# Patient Record
Sex: Male | Born: 1955
Health system: Southern US, Community
[De-identification: ages and names within clinical notes are randomized; demographics above are authoritative.]

## PROBLEM LIST (undated history)

## (undated) DIAGNOSIS — I10 Essential (primary) hypertension: Secondary | ICD-10-CM

## (undated) DIAGNOSIS — I4891 Unspecified atrial fibrillation: Secondary | ICD-10-CM

## (undated) DIAGNOSIS — E785 Hyperlipidemia, unspecified: Secondary | ICD-10-CM

## (undated) HISTORY — DX: Unspecified atrial fibrillation: I48.91

## (undated) HISTORY — DX: Hyperlipidemia, unspecified: E78.5

## (undated) HISTORY — PX: VITRECTOMY: SHX106

## (undated) HISTORY — PX: KNEE SURGERY: SHX244

## (undated) HISTORY — DX: Essential (primary) hypertension: I10

---

## 2008-03-27 ENCOUNTER — Ambulatory Visit (HOSPITAL_COMMUNITY): Admission: RE | Admit: 2008-03-27 | Discharge: 2008-03-28 | Payer: Self-pay | Admitting: Ophthalmology

## 2008-05-08 ENCOUNTER — Ambulatory Visit (HOSPITAL_COMMUNITY): Admission: RE | Admit: 2008-05-08 | Discharge: 2008-05-09 | Payer: Self-pay | Admitting: Ophthalmology

## 2009-11-15 IMAGING — CR DG CHEST 2V
2 series · 2 of 2 positions shown · non-contrast
Comparison: None

CLINICAL DATA: Retinal detachment lefteye. Hypertension.

CHEST - 2 VIEW

[view not recorded (1 of 2)]
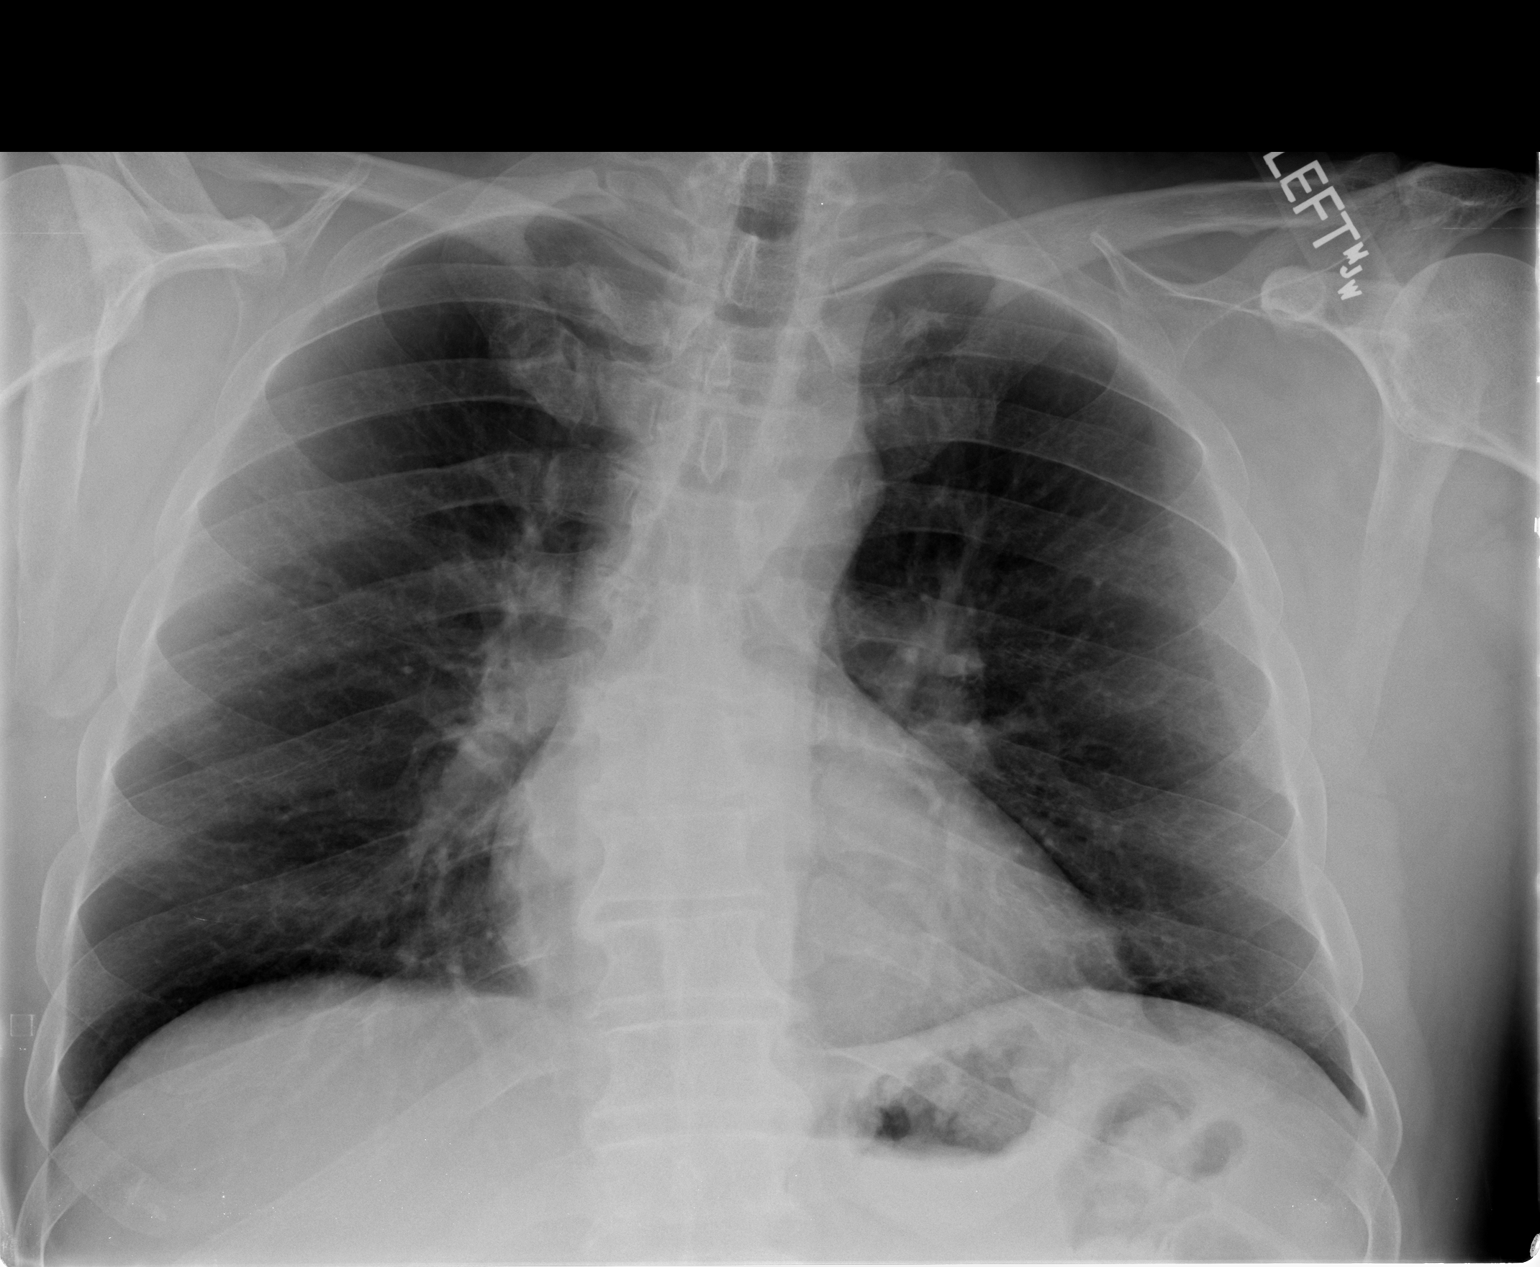

[view not recorded (2 of 2)]
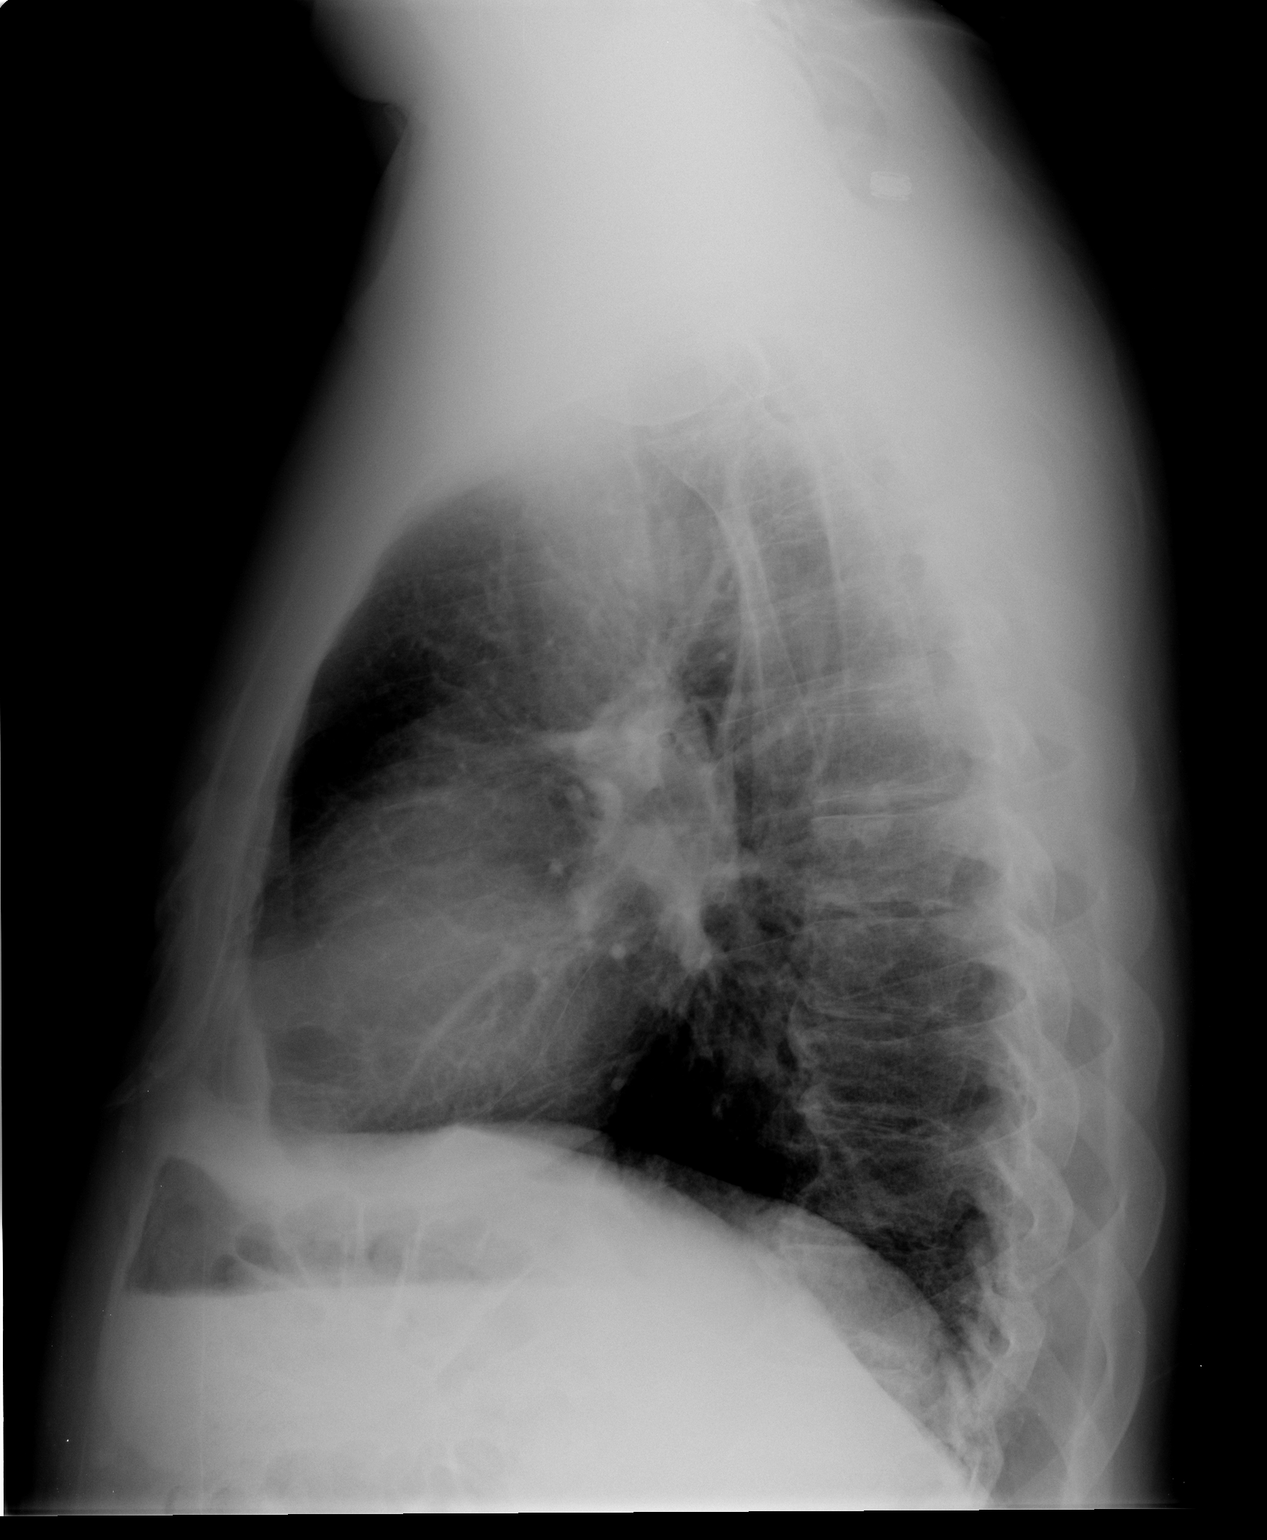

[2 of 2 positions shown; findings below may reference images not displayed]

FINDINGS: The heart size and mediastinal contours are within normal
limits.  Both lungs are clear.  The visualized skeletal structures
are unremarkable.
IMPRESSION: No active cardiopulmonary disease.

## 2010-06-03 ENCOUNTER — Ambulatory Visit (HOSPITAL_COMMUNITY)
Admission: RE | Admit: 2010-06-03 | Discharge: 2010-06-04 | Disposition: A | Discharge status: Home / Self Care | Payer: BC Managed Care – PPO | Source: Ambulatory Visit | Attending: Cardiology | Admitting: Cardiology

## 2010-06-03 ENCOUNTER — Encounter: Payer: Self-pay | Admitting: Cardiology

## 2010-06-03 ENCOUNTER — Ambulatory Visit (HOSPITAL_COMMUNITY): Payer: BC Managed Care – PPO

## 2010-06-03 DIAGNOSIS — E785 Hyperlipidemia, unspecified: Secondary | ICD-10-CM | POA: Insufficient documentation

## 2010-06-03 DIAGNOSIS — I4891 Unspecified atrial fibrillation: Secondary | ICD-10-CM | POA: Insufficient documentation

## 2010-06-03 DIAGNOSIS — H33009 Unspecified retinal detachment with retinal break, unspecified eye: Secondary | ICD-10-CM | POA: Insufficient documentation

## 2010-06-03 DIAGNOSIS — I519 Heart disease, unspecified: Secondary | ICD-10-CM | POA: Insufficient documentation

## 2010-06-03 DIAGNOSIS — Y838 Other surgical procedures as the cause of abnormal reaction of the patient, or of later complication, without mention of misadventure at the time of the procedure: Secondary | ICD-10-CM | POA: Insufficient documentation

## 2010-06-03 DIAGNOSIS — Y9210 Unspecified residential institution as the place of occurrence of the external cause: Secondary | ICD-10-CM | POA: Insufficient documentation

## 2010-06-03 DIAGNOSIS — I10 Essential (primary) hypertension: Secondary | ICD-10-CM | POA: Insufficient documentation

## 2010-06-03 DIAGNOSIS — M109 Gout, unspecified: Secondary | ICD-10-CM | POA: Insufficient documentation

## 2010-06-03 LAB — CBC
Platelets: 251 10*3/uL (ref 150–400)
RBC: 4.96 MIL/uL (ref 4.22–5.81)
RDW: 12.3 % (ref 11.5–15.5)
WBC: 12 10*3/uL — ABNORMAL HIGH (ref 4.0–10.5)

## 2010-06-03 LAB — BASIC METABOLIC PANEL
Chloride: 105 mEq/L (ref 96–112)
GFR calc non Af Amer: >60 mL/min (ref >60)
Potassium: 5 mEq/L (ref 3.5–5.1)
Sodium: 142 mEq/L (ref 135–145)

## 2010-06-03 LAB — SURGICAL PCR SCREEN
MRSA, PCR: NEGATIVE
Staphylococcus aureus: POSITIVE — AB

## 2010-06-04 ENCOUNTER — Encounter: Payer: Self-pay | Admitting: Internal Medicine

## 2010-06-04 ENCOUNTER — Encounter: Payer: Self-pay | Admitting: Cardiology

## 2010-06-04 DIAGNOSIS — I4891 Unspecified atrial fibrillation: Secondary | ICD-10-CM

## 2010-06-04 LAB — CARDIAC PANEL(CRET KIN+CKTOT+MB+TROPI)
Relative Index: INVALID (ref 0.0–2.5)
Relative Index: INVALID (ref 0.0–2.5)
Troponin I: 0.04 ng/mL (ref 0.00–0.06)

## 2010-06-04 LAB — BASIC METABOLIC PANEL
Calcium: 9.5 mg/dL (ref 8.4–10.5)
GFR calc Af Amer: >60 mL/min (ref >60)
GFR calc non Af Amer: 56 mL/min — ABNORMAL LOW (ref >60)
Glucose, Bld: 215 mg/dL — ABNORMAL HIGH (ref 70–99)
Sodium: 140 mEq/L (ref 135–145)

## 2010-06-04 LAB — MRSA PCR SCREENING: MRSA by PCR: NEGATIVE

## 2010-06-10 NOTE — Consult Note (Signed)
  NAME:  Nicholas Oneill, VIRGIN NO.:  [id number]  MEDICAL RECORD NO.:  [id number]           PATIENT TYPE:  I  LOCATION:  2924                         FACILITY:  [facility]  PHYSICIAN:  Armanda Magic, M.D.     DATE OF BIRTH:  Jun 19, 1955  DATE OF CONSULTATION:  06/04/2010 DATE OF DISCHARGE:                                CONSULTATION   REFERRING PHYSICIAN:  John D. Ashley Royalty, MD  CHIEF COMPLAINT:  Rapid atrial fibrillation, postoperative.  HISTORY OF PRESENT ILLNESS:  This is a 55 year old male with no prior cardiac history.  He underwent vitrectomy for right retinal detachment tonight, but postop felt atrial fibrillation with right ventricular response.  He was transferred to the CCU and placed on IC Cardizem drip, and subsequently converted to sinus rhythm.  He states that has had palpitations off and on in the past, but has never been evaluated.  PAST MEDICAL HISTORY:  Dyslipidemia, hypertension, and gout.  ALLERGIES:  None.MEDICATIONS:  Benicar, colchicine, Zocor.  PAST SURGICAL HISTORY:  Status post left eye vitrectomy and most recently right eye vitrectomy tonight, and also knee surgery as a child.  FAMILY HISTORY:  His mother died of unknown cause.  His father died of MI.  He has one brother with a heart problem.  SOCIAL HISTORY:  He is married, with 2 children.  He denies any tobacco use, occasional drug, alcohol, but rarely.  REVIEW OF SYSTEMS:  Otherwise stated in the HPI is negative.  PHYSICAL EXAMINATION:  VITAL SIGNS:  Blood pressure 140/79, heart rate 105. GENERAL:  He is well-developed, well-nourished white male in no acute distress. HEENT:  Benign. NECK:  Supple without lymphadenopathy.  Carotid upstrokes +2 bilaterally.  No bruits. LUNGS:  Clear to auscultation throughout. HEART:  Regular rate and rhythm.  No murmurs, rubs, or gallops.  Normal S1 and S2. ABDOMEN:  Soft, nontender, and nondistended with active bowel sounds. No  hepatosplenomegaly. EXTREMITIES:  No cyanosis, erythema, or edema.  EKG #1 preop shows sinus rhythm.  EKG #2, postop shows atrial fibrillation with rapid ventricular response, currently in sinus rhythm.  LABORATORY DATA:  Sodium 140, potassium 4.3, chloride 106, bicarb 20, BUN 16, creatinine 1.33.  White cell count 12, hemoglobin 16.5, hematocrit 47.5, and platelet count 251.  ASSESSMENT: 1. Rapid atrial fibrillation, postop after eye surgery now in sinus     rhythm with IV Cardizem drip. 2. Retinal detachment, status post right vitrectomy. 3. Dyslipidemia. 4. Hypertension. 5. Gout.  PLAN:  Continue IV Cardizem drip overnight.  We will check 2-D echocardiogram in the morning.  We will cycle cardiac enzymes.     Armanda Magic, M.D.     TT/MEDQ  D:  06/04/2010  T:  06/04/2010  Job:  [id number]  cc:   Bevelyn Buckles. Bensimhon, MD  Electronically Signed by Armanda Magic M.D. on 06/10/2010 12:08:07 PM

## 2010-06-11 NOTE — Discharge Summary (Signed)
  NAME:  Nicholas Oneill, Nicholas Oneill NO.:  [id number]  MEDICAL RECORD NO.:  [id number]           PATIENT TYPE:  I  LOCATION:  2924                         FACILITY:  [facility]  PHYSICIAN:  Cassell Clement, M.D. DATE OF BIRTH:  01/19/1956  DATE OF ADMISSION:  06/03/2010 DATE OF DISCHARGE:  06/04/2010                              DISCHARGE SUMMARY   PRIMARY CARDIOLOGIST:  Learta Codding, MD, [facility].  DISCHARGE DIAGNOSIS:  Atrial fibrillation with rapid ventricular response.  SECONDARY DIAGNOSES: 1. Hypertension. 2. Hyperlipidemia. 3. Gout. 4. Right retinal detachment status post scleral buckle with right eye     and retinal photocoagulation by Dr. Alan Mulder.  ALLERGIES:  No known drug allergies.  PROCEDURES:  2-D echocardiogram, February 2012, showing an EF of 60% to 65% with normal wall motion.  Mild LVH.  No regional wall motion abnormalities.  HISTORY OF PRESENT ILLNESS:  A 55 year old male with the above problem list.  The patient has a history of right retinal detachment and presented to [facility] for right eye surgery which was uneventful. Unfortunately, postoperatively the patient developed tachypalpitations, subsequently found to have AFib with RVR.  Cardiology was called for evaluation and admission.  HOSPITAL COURSE:  The patient was placed on IV diltiazem with initial rate improvement and subsequent conversion to sinus rhythm.  Cardiac enzymes have remained negative.  A 2-D echocardiogram this morning shows normal LV function.  The patient has a CHADS2 score of 1 with a prior history of hypertension, and at this time, he is not on Coumadin or aspirin secondary to recent eye surgery.  We do recommend initiating aspirin in the long-term once this is okay with Ophthalmology.  The patient's diltiazem has been converted to oral diltiazem and he will be discharged home today in good condition.  DISCHARGE LABS:  Hemoglobin 16.5, hematocrit 47.5, WBC 12.0,  platelets 251.  Sodium 140, potassium 4.3, chloride 106, CO2 of 23, BUN 16, creatinine 1.33, glucose 215, calcium 9.5, CK 62, MB 1.8, troponin I is 0.03.  DISPOSITION:  The patient will be discharged home today in good condition.  FOLLOWUP PLANS AND APPOINTMENTS:  We have arranged for followup with Dr. Andee Lineman in our [place] office on June 17, 2010 at 3.30 p.m.  The patient will follow up with Dr. Ashley Royalty next week.  DISCHARGE MEDICATIONS: 1. Bacitracin/polymyxin B ophthalmic ointment, right eye, 3 times a     day. 2. Diltiazem CD 180 mg daily. 3. Gatifloxacin 0.5% 1 drop q.i.d. 4. Prednisolone ophthalmic 1% 1 drop q.i.d. 5. Cherry concentrate over-the-counter 1 tablet daily. 6. Colchicine 0.6 mg daily. 7. Toprol-XL 100 mg daily. 8. Multivitamin 1 tablet daily. 9. Simvastatin 20 mg every evening.  OUTSTANDING LAB STUDIES:  None.  DURATION OF DISCHARGE/ENCOUNTER:  45 minutes including physician time.     Nicolasa Ducking, ANP   ______________________________ Cassell Clement, M.D.    CB/MEDQ  D:  06/04/2010  T:  06/05/2010  Job:  [id number]  cc:   Beulah Gandy. Ashley Royalty, M.D.  Electronically Signed by Nicolasa Ducking ANP on 06/09/2010 12:06:18 PM Electronically Signed by Cassell Clement M.D. on 06/11/2010 01:03:59 PM

## 2010-06-17 ENCOUNTER — Encounter (INDEPENDENT_AMBULATORY_CARE_PROVIDER_SITE_OTHER): Payer: BC Managed Care – PPO | Admitting: Cardiology

## 2010-06-17 ENCOUNTER — Encounter: Payer: Self-pay | Admitting: Cardiology

## 2010-06-17 ENCOUNTER — Encounter: Payer: BC Managed Care – PPO | Admitting: Cardiology

## 2010-06-17 DIAGNOSIS — M109 Gout, unspecified: Secondary | ICD-10-CM | POA: Insufficient documentation

## 2010-06-17 DIAGNOSIS — I4891 Unspecified atrial fibrillation: Secondary | ICD-10-CM | POA: Insufficient documentation

## 2010-06-17 DIAGNOSIS — I10 Essential (primary) hypertension: Secondary | ICD-10-CM | POA: Insufficient documentation

## 2010-06-17 DIAGNOSIS — E785 Hyperlipidemia, unspecified: Secondary | ICD-10-CM | POA: Insufficient documentation

## 2010-06-18 NOTE — Consult Note (Signed)
Summary: [facility] Consultation Report  [facility] Consultation Report   Imported By: Earl Many 06/10/2010 17:36:16  _____________________________________________________________________  External Attachment:    Type:   Image     Comment:   External Document

## 2010-06-30 NOTE — Op Note (Signed)
  NAME:  Nicholas Oneill, Nicholas Oneill NO.:  [id number]  MEDICAL RECORD NO.:  [id number]           PATIENT TYPE:  I  LOCATION:  2924                         FACILITY:  [facility]  PHYSICIAN:  Beulah Gandy. Ashley Royalty, M.D. DATE OF BIRTH:  08-08-1955  DATE OF PROCEDURE:  06/03/2010 DATE OF DISCHARGE:                              OPERATIVE REPORT   ADMISSION DIAGNOSIS:  Rhegmatogenous retinal detachment in the right eye.  PROCEDURE:  Scleral buckle, right eye and retinal photocoagulation, right eye.  SURGEON:  Beulah Gandy. Ashley Royalty, MD  ASSISTANT:  Rosalie Doctor, MA  ANESTHESIA:  General.  DETAILS:  After usual prep and drape, 360-degree limbal peritomy, isolation of 4 rectus muscles on 2-0 silk, localization of detachment from 3 o'clock to 9 o'clock.  Scleral dissection from 1 o'clock to 11 o'clock to admit a #279 intrascleral implant.  Diathermy placed in the bed.  The 279 implant placed against the globe with a 240 band and a 270 sleeve at 1 o'clock.  Perforation site chosen at 6 o'clock.  At the posterior aspect of the bed, a moderate amount of clear yellow subretinal fluid came forth in a controlled manner.  The fluid was xanthochromic.  The scleral flaps were closed with 4-0 Mersilene suture. Paracentesis x2 obtained a pressure of 10 with a Barraquer tonometer. The scleral flaps were closed, sutures knotted, and the free ends removed.  The buckle was trimmed.  The band was trimmed.  Indirect ophthalmoscopy revealed the retina to be lying nicely in place on the scleral buckle.  The indirect ophthalmoscope laser was moved into place, 481 burns were placed in the retinal periphery and the power was between 300 and 400 milliwatts, 1000 microns each, and 0.1 seconds each.  The conjunctiva was reposited with 7-0 chromic suture.  Polymyxin and gentamicin were irrigated into Tenon space.  Atropine solution was applied.  Marcaine was injected around the globe for postop pain. Closing pressure  was 10 with a Barraquer tonometer.  Decadron 10 mg was injected into the lower subconjunctival space.  Polysporin ophthalmic ointment, patch, and shield were placed.  The patient was awakened and taken to recovery in a satisfactory condition.     Beulah Gandy. Ashley Royalty, M.D.     JDM/MEDQ  D:  06/03/2010  T:  06/04/2010  Job:  [id number]  Electronically Signed by Alan Mulder M.D. on 06/30/2010 06:45:02 AM

## 2010-07-08 NOTE — Assessment & Plan Note (Signed)
Summary: eph d/c [facility] for A Fib per chris B. Vs   Visit Type:  Follow-up Primary Provider:  Selinda Flavin   History of Present Illness: The patient had a recent right retinal detachment and presented to [facility] for right eye surgery which was complicated by postoperative atrial fibrillation.  Cardiology is called for an evaluation and management.  The patient was placed on IV diltiazem and converted back to normal sinus rhythm.  An echocardiogram was performed which showed normal LV function.  No aspirin or Coumadin was started given his recent retinal detachment.CHADS2 score =1.  The patient also has a history of hypertension, hyperlipidemia and gout. The patient has had no recurrent problems with palpitations.  He has been started on p.o. Cardizem.  He has done well with this in addition to his metoprolol.   Preventive Screening-Counseling & Management  Alcohol-Tobacco     Smoking Status: never  Current Medications (verified): 1)  Prednisolone Acetate 1 % Susp (Prednisolone Acetate) .Marland Kitchen.. 1 Drop Od Four Times A Day 2)  Zymaxid 0.5 % Soln (Gatifloxacin) .Marland Kitchen.. 1 Drop Od Four Times A Day 3)  Polysporin 500-10000 Unit/gm Oint (Bacitracin-Polymyxin B) .... Apply Od Three Times A Day 4)  Cardizem Cd 180 Mg Xr24h-Cap (Diltiazem Hcl Coated Beads) .... Take 1 Tablet By Mouth Once A Day 5)  Multivitamins  Tabs (Multiple Vitamin) .... Take 1 Tablet By Mouth Once A Day 6)  Colcrys 0.6 Mg Tabs (Colchicine) .... Take 1 Tablet By Mouth Once A Day 7)  Metoprolol Succinate 100 Mg Xr24h-Tab (Metoprolol Succinate) .... Take 1 Tablet By Mouth Once A Day 8)  Simvastatin 20 Mg Tabs (Simvastatin) .... Take 1 Tablet By Mouth Once A Day 9)  Tart Cherry Advanced  Caps (Misc Natural Products) .... Take 1 Tablet By Mouth Once A Day  Allergies (verified): No Known Drug Allergies  Comments:  Nurse/Medical Assistant: The patient's medications and allergies were reviewed with the patient and were  updated in the Medication and Allergy Lists. Tammi Romine CMA (07-08-10 1:44 PM)  Past History:  Past Medical History: Last updated: 2010-07-08   Dyslipidemia   Hypertension   Gout Atrial Fibrillation  Family History: Last updated: 08-Jul-2010  His mother died of unknown cause.     He has one brother with a heart problem.   His father died of MI  Social History: Last updated: 07-08-2010 Married  Alcohol Use - yes- on occasion  occasional drug use per note of [facility] Feb 2012  Risk Factors: Smoking Status: never (07-08-2010)  Social History: Smoking Status:  never  Review of Systems  The patient denies fatigue, malaise, fever, weight gain/loss, vision loss, decreased hearing, hoarseness, chest pain, palpitations, shortness of breath, prolonged cough, wheezing, sleep apnea, coughing up blood, abdominal pain, blood in stool, nausea, vomiting, diarrhea, heartburn, incontinence, blood in urine, muscle weakness, joint pain, leg swelling, rash, skin lesions, headache, fainting, dizziness, depression, anxiety, enlarged lymph nodes, easy bruising or bleeding, and environmental allergies.    Vital Signs:  Patient profile:   55 year old male Height:      72 inches Weight:      229 pounds BMI:     31.17 O2 Sat:      97 % on Room air Pulse rate:   74 / minute BP sitting:   158 / 88  (left arm) Cuff size:   large  Vitals Entered By: Fuller Plan CMA (08-Jul-2010 1:39 PM)  O2 Flow:  Room  air  Physical Exam  Additional Exam:  General: Well-developed, well-nourished in no distress head: Normocephalic and atraumatic eyes PERRLA/EOMI intact, conjunctiva and lids normal nose: No deformity or lesions mouth normal dentition, normal posterior pharynx neck: Supple, no JVD.  No masses, thyromegaly or abnormal cervical nodes lungs: Normal breath sounds bilaterally without wheezing.  Normal percussion heart: regular rate and rhythm with normal S1 and S2, no S3 or S4.  PMI is  normal.  No pathological murmurs abdomen: Normal bowel sounds, abdomen is soft and nontender without masses, organomegaly or hernias noted.  No hepatosplenomegaly musculoskeletal: Back normal, normal gait muscle strength and tone normal pulsus: Pulse is normal in all 4 extremities Extremities: No peripheral pitting edema neurologic: Alert and oriented x 3 skin: Intact without lesions or rashes cervical nodes: No significant adenopathy psychologic: Normal affect    EKG  Procedure date:  06/17/2010  Findings:      normal sinus rhythm  otherwise normal EKG.  Heart rate 67 beats/min  Impression & Recommendations:  Problem # 1:  ATRIAL FIBRILLATION (ICD-427.31) postoperative atrial fibrillation no indication for anticoagulation aspirin can be started when safe from a perspective of his retinal detachment. His updated medication list for this problem includes:    Metoprolol Succinate 100 Mg Xr24h-tab (Metoprolol succinate) .Marland Kitchen... Take 1 tablet by mouth once a day  Orders: EKG w/ Interpretation (93000)  Problem # 2:  HYPERTENSION, BENIGN (ICD-401.1) normal LV function White coat hypertension as the patient to follow-up on his blood pressure.  His updated medication list for this problem includes:    Cardizem Cd 180 Mg Xr24h-cap (Diltiazem hcl coated beads) .Marland Kitchen... Take 1 tablet by mouth once a day    Metoprolol Succinate 100 Mg Xr24h-tab (Metoprolol succinate) .Marland Kitchen... Take 1 tablet by mouth once a day  Patient Instructions: 1)  Your physician wants you to follow-up in: 6 months. You will receive a reminder letter in the mail one-two months in advance. If you don't receive a letter, please call our office to schedule the follow-up appointment. 2)  Your physician recommends that you continue on your current medications as directed. Please refer to the Current Medication list given to you today.

## 2010-08-11 LAB — BASIC METABOLIC PANEL
CO2: 22 mEq/L (ref 19–32)
Calcium: 9.3 mg/dL (ref 8.4–10.5)
GFR calc Af Amer: >60 mL/min (ref >60)
Potassium: 4.6 mEq/L (ref 3.5–5.1)
Sodium: 136 mEq/L (ref 135–145)

## 2010-08-11 LAB — CBC
Hemoglobin: 16.2 g/dL (ref 13.0–17.0)
MCHC: 33.8 g/dL (ref 30.0–36.0)
RBC: 4.94 MIL/uL (ref 4.22–5.81)

## 2010-09-09 NOTE — Op Note (Signed)
NAME:  Nicholas Oneill, HARMES NO.:  [id number]   MEDICAL RECORD NO.:  [id number]          PATIENT TYPE:  OIB   LOCATION:  5118                         FACILITY:  [facility]   PHYSICIAN:  Beulah Gandy. Ashley Royalty, M.D. DATE OF BIRTH:  10/08/1955   DATE OF PROCEDURE:  05/08/2008  DATE OF DISCHARGE:                               OPERATIVE REPORT   ADMISSION DIAGNOSIS:  Proliferative vitreoretinopathy with vitreous  traction.   PROCEDURES:  Pars plana vitrectomy with 25-gauge system, retinal  photocoagulation, membrane peel, and gas-fluid exchange, left eye.   SURGEON:  Beulah Gandy. Ashley Royalty, MD   ASSISTANT:  Rosalie Doctor, MA   ANESTHESIA:  General.   DETAILS:  Usual prep and drape, 25-gauge trocars at 10, 2, and 4  o'clock, infusion at 4 o'clock.  The lighted pick and the cutter were  placed at 10 and 2 o'clock respectively.  Provisc placed on the corneal  surface.  The BIOM viewing system was moved into place.  Pars plana  vitrectomy was begun just behind the crystalline lens.  A thick fibrous  ring was seen centrally, which extended with thick fibrous sheaths to  the edges of the buckle.  This ring was carefully removed under low  suction and rapid cutting after it was incised and many meridians that  was incised and removed down to the buckle surface and the retina  surface.  The membrane was peeled along the retinal surface on the  buckle with the lighted pick.  Additional vitrectomy was carried  posteriorly down to the macular surface.  Some surface wrinkling was  seen temporally with the retina thrown into flat folds.  The area was  bright.  Additional vitrectomy was carried out down to the vitreous base  for 360 degrees.  The endolaser was positioned in the eye.  1130 burns  placed around the retinal periphery, the power was 2300 milliwatts, 1000  microns each, and 0.1 seconds each.  A 30% gas-fluid exchange was  performed.  The instruments were removed from the eye, and the  trocars  were removed.  The wounds were tested and found to be tight.  Polymyxin  and gentamicin were irrigated into Tenon space.  Decadron 10 mg was  injected into the lower subconjunctival space.  Marcaine was injected  around the globe for postop pain.  Polysporin ophthalmic ointment, a  patch and shield were placed.  The patient was awakened, taken to  recovery in satisfactory condition.  Closing pressure 10 with a  Barraquer tonometer.   COMPLICATIONS:  None.   DURATION:  30 minutes.      Beulah Gandy. Ashley Royalty, M.D.  Electronically Signed     JDM/MEDQ  D:  05/08/2008  T:  05/09/2008  Job:  [id number]

## 2010-09-09 NOTE — Op Note (Signed)
NAME:  Nicholas Oneill, Nicholas Oneill NO.:  [id number]   MEDICAL RECORD NO.:  [id number]          PATIENT TYPE:  OIB   LOCATION:  5151                         FACILITY:  [facility]   PHYSICIAN:  Beulah Gandy. Ashley Royalty, M.D. DATE OF BIRTH:  1955/07/14   DATE OF PROCEDURE:  03/27/2008  DATE OF DISCHARGE:                               OPERATIVE REPORT   ADMISSION DIAGNOSIS:  Rhegmatogenous retinal detachment, left eye.   PROCEDURE:  1. Scleral buckle, left eye.  2. Retinal photocoagulation, left eye.  3. Gas injection, left eye.   SURGEON:  Beulah Gandy. Ashley Royalty, MD   ASSISTANT:  Rosalie Doctor, MA   ANESTHESIA:  General.   DETAILS:  Usual prep and drape.  A 360-degree limbal peritomy.  Isolation of 4 rectus muscles on 2-0 silk.  Scleral dissection per 360  degrees to admit #279 intrascleral implant.  Diathermy placed in the  bed.  Radial 508G segment was fashioned to fit beneath the breaks at 9  o'clock.  A 279 implant was placed around the globe with a joint at 3  o'clock, 240 band placed around the globe with a 270 sleeve at 10  o'clock.  Perforation site chosen at 8 o'clock in the posterior aspect  of the bed.  A large amount of clear colorless subretinal fluid came  forth.  Perfluoropropane 35% 2 mL was injected into the vitreous cavity  to reinflate the globe.  The buckle elements were placed, and the  scleral flaps were closed.  Indirect ophthalmoscopy showed the retina to  be lying nicely and was placed on the scleral buckle with the breaks  stretched across the radial element at 9 o'clock.  The indirect  ophthalmoscope laser was moved into place, 1013 burns were placed around  the retinal breaks and the periphery.  The power was 1200 milliwatts,  1000 microns each 0.1 seconds each.  The buckle was adjusted and  trimmed.  The band was adjusted and trimmed.  The sutures were knotted,  and the free ends removed.  The conjunctiva was reposited with 7-0  chromic suture.  Polymyxin and  gentamicin were irrigated at Tenon space.  Atropine solution was applied.  Decadron 10 mg was injected into the  lower subconjunctival space.  Marcaine was injected around the globe for  postop pain.  Closing pressure was 10 with a Barraquer tonometer.  Polysporin ophthalmic ointment, a patch, and shield were placed.  The  patient was awakened and taken to the recovery in satisfactory  condition.   COMPLICATIONS:  None.   DURATION:  2 hours.     Beulah Gandy. Ashley Royalty, M.D.  Electronically Signed    JDM/MEDQ  D:  03/27/2008  T:  03/28/2008  Job:  [id number]

## 2010-11-20 ENCOUNTER — Encounter (INDEPENDENT_AMBULATORY_CARE_PROVIDER_SITE_OTHER): Payer: BC Managed Care – PPO | Admitting: Ophthalmology

## 2010-11-20 DIAGNOSIS — H35379 Puckering of macula, unspecified eye: Secondary | ICD-10-CM

## 2010-11-20 DIAGNOSIS — H43819 Vitreous degeneration, unspecified eye: Secondary | ICD-10-CM

## 2010-11-20 DIAGNOSIS — H26499 Other secondary cataract, unspecified eye: Secondary | ICD-10-CM

## 2010-11-20 DIAGNOSIS — H33009 Unspecified retinal detachment with retinal break, unspecified eye: Secondary | ICD-10-CM

## 2010-12-26 ENCOUNTER — Other Ambulatory Visit: Payer: Self-pay | Admitting: Cardiovascular Disease

## 2011-01-20 ENCOUNTER — Encounter: Payer: Self-pay | Admitting: Cardiology

## 2011-01-22 ENCOUNTER — Encounter: Payer: Self-pay | Admitting: Cardiology

## 2011-01-22 ENCOUNTER — Ambulatory Visit: Payer: BC Managed Care – PPO | Admitting: Cardiology

## 2011-01-23 ENCOUNTER — Encounter (INDEPENDENT_AMBULATORY_CARE_PROVIDER_SITE_OTHER): Payer: BC Managed Care – PPO | Admitting: Ophthalmology

## 2011-01-28 ENCOUNTER — Encounter (INDEPENDENT_AMBULATORY_CARE_PROVIDER_SITE_OTHER): Payer: BC Managed Care – PPO | Admitting: Ophthalmology

## 2011-01-28 DIAGNOSIS — H26499 Other secondary cataract, unspecified eye: Secondary | ICD-10-CM

## 2011-01-28 DIAGNOSIS — H33009 Unspecified retinal detachment with retinal break, unspecified eye: Secondary | ICD-10-CM

## 2011-01-28 DIAGNOSIS — H35379 Puckering of macula, unspecified eye: Secondary | ICD-10-CM

## 2011-01-28 DIAGNOSIS — H43819 Vitreous degeneration, unspecified eye: Secondary | ICD-10-CM

## 2011-01-30 LAB — COMPREHENSIVE METABOLIC PANEL
CO2: 26 mEq/L (ref 19–32)
Calcium: 9.3 mg/dL (ref 8.4–10.5)
Creatinine, Ser: 0.87 mg/dL (ref 0.4–1.5)
GFR calc Af Amer: >60 mL/min (ref >60)
GFR calc non Af Amer: >60 mL/min (ref >60)
Glucose, Bld: 88 mg/dL (ref 70–99)

## 2011-01-30 LAB — CBC
Hemoglobin: 15.2 g/dL (ref 13.0–17.0)
MCHC: 33.7 g/dL (ref 30.0–36.0)
MCV: 97.2 fL (ref 78.0–100.0)
RBC: 4.64 MIL/uL (ref 4.22–5.81)

## 2011-02-02 ENCOUNTER — Encounter: Payer: Self-pay | Admitting: Cardiology

## 2011-02-02 ENCOUNTER — Ambulatory Visit (INDEPENDENT_AMBULATORY_CARE_PROVIDER_SITE_OTHER): Payer: BC Managed Care – PPO | Admitting: Cardiology

## 2011-02-02 VITALS — BP 159/93 | HR 65 | Resp 18 | Ht 72.0 in | Wt 234.0 lb

## 2011-02-02 DIAGNOSIS — I10 Essential (primary) hypertension: Secondary | ICD-10-CM

## 2011-02-02 DIAGNOSIS — I4891 Unspecified atrial fibrillation: Secondary | ICD-10-CM

## 2011-02-02 MED ORDER — DILTIAZEM HCL ER COATED BEADS 180 MG PO CP24: 180.0000 mg | ORAL_CAPSULE | Freq: Every day | ORAL | Status: DC

## 2011-02-02 NOTE — Assessment & Plan Note (Signed)
Whitecoat hypertension. Otherwise stable. No change in medical regimen for now

## 2011-02-02 NOTE — Patient Instructions (Signed)
Continue all current medications. Your physician wants you to follow up in:  1 year.  You will receive a reminder letter in the mail one-two months in advance.  If you don't receive a letter, please call our office to schedule the follow up appointment   

## 2011-02-02 NOTE — Progress Notes (Signed)
History of present illness:  The patient is a 55 year old male with a history of postoperative atrial fibrillation after eye surgery. The patient is currently not on Coumadin and remains in normal sinus rhythm. He has had no recurrent palpitations. He is both on a beta blocker and calcium channel blocker. He denies any chest pain shortness of breath orthopnea PND.   Allergies, family history and social history: As documented in chart and reviewed  Medications: Documented and reviewed in chart.  Past medical history: Reviewed and see problem list below   Review of systems:   Physical examination : No nausea or vomiting. No fever or chills no melena hematochezia. No dysuria or frequency Vital signs documented below General: Well-nourished white male in no apparent distress HEENT: Normal carotids stroke no carotid bruits, no thyromegaly nonnodular thyroid. EOMI PERRLA Lungs: Clear breath sounds bilaterally. No wheezing Heart: Regular rate and rhythm with normal S1-S2 no murmur rubs or gallops Abdomen: Soft nontender no rebound or guarding Extremity exam: No cyanosis clubbing or edema Neurologic: Alert and oriented grossly nonfocal Psychiatric: Normal affect. Vascular exam: Normal pulses.

## 2011-02-02 NOTE — Assessment & Plan Note (Signed)
History of paroxysmal atrial fibrillation. Patient remains in normal sinus rhythm. No indication for anticoagulation

## 2011-02-03 ENCOUNTER — Other Ambulatory Visit: Payer: Self-pay | Admitting: *Deleted

## 2011-02-03 MED ORDER — DILTIAZEM HCL ER COATED BEADS 180 MG PO CP24: 180.0000 mg | ORAL_CAPSULE | Freq: Every day | ORAL | Status: DC

## 2011-02-11 ENCOUNTER — Encounter (INDEPENDENT_AMBULATORY_CARE_PROVIDER_SITE_OTHER): Payer: BC Managed Care – PPO | Admitting: Ophthalmology

## 2011-02-11 DIAGNOSIS — H2700 Aphakia, unspecified eye: Secondary | ICD-10-CM

## 2011-08-12 ENCOUNTER — Ambulatory Visit (INDEPENDENT_AMBULATORY_CARE_PROVIDER_SITE_OTHER): Payer: BC Managed Care – PPO | Admitting: Ophthalmology

## 2011-08-12 DIAGNOSIS — H33009 Unspecified retinal detachment with retinal break, unspecified eye: Secondary | ICD-10-CM

## 2011-08-12 DIAGNOSIS — H43819 Vitreous degeneration, unspecified eye: Secondary | ICD-10-CM

## 2011-08-12 DIAGNOSIS — H35379 Puckering of macula, unspecified eye: Secondary | ICD-10-CM

## 2011-11-10 ENCOUNTER — Encounter (INDEPENDENT_AMBULATORY_CARE_PROVIDER_SITE_OTHER): Payer: BC Managed Care – PPO | Admitting: Ophthalmology

## 2011-11-10 DIAGNOSIS — H33009 Unspecified retinal detachment with retinal break, unspecified eye: Secondary | ICD-10-CM

## 2011-11-10 DIAGNOSIS — H35379 Puckering of macula, unspecified eye: Secondary | ICD-10-CM

## 2011-11-10 DIAGNOSIS — H4010X0 Unspecified open-angle glaucoma, stage unspecified: Secondary | ICD-10-CM

## 2011-11-10 DIAGNOSIS — H2700 Aphakia, unspecified eye: Secondary | ICD-10-CM

## 2012-05-10 ENCOUNTER — Ambulatory Visit (INDEPENDENT_AMBULATORY_CARE_PROVIDER_SITE_OTHER): Payer: BC Managed Care – PPO | Admitting: Ophthalmology

## 2012-05-25 ENCOUNTER — Ambulatory Visit (INDEPENDENT_AMBULATORY_CARE_PROVIDER_SITE_OTHER): Payer: BC Managed Care – PPO | Admitting: Ophthalmology

## 2012-05-25 DIAGNOSIS — H43819 Vitreous degeneration, unspecified eye: Secondary | ICD-10-CM

## 2012-05-25 DIAGNOSIS — H35379 Puckering of macula, unspecified eye: Secondary | ICD-10-CM

## 2012-05-25 DIAGNOSIS — H35359 Cystoid macular degeneration, unspecified eye: Secondary | ICD-10-CM

## 2012-05-25 DIAGNOSIS — H33009 Unspecified retinal detachment with retinal break, unspecified eye: Secondary | ICD-10-CM

## 2013-02-22 ENCOUNTER — Ambulatory Visit (INDEPENDENT_AMBULATORY_CARE_PROVIDER_SITE_OTHER): Payer: BC Managed Care – PPO | Admitting: Ophthalmology

## 2013-02-22 DIAGNOSIS — H43819 Vitreous degeneration, unspecified eye: Secondary | ICD-10-CM

## 2013-02-22 DIAGNOSIS — H33009 Unspecified retinal detachment with retinal break, unspecified eye: Secondary | ICD-10-CM

## 2013-02-22 DIAGNOSIS — H35379 Puckering of macula, unspecified eye: Secondary | ICD-10-CM

## 2013-12-13 ENCOUNTER — Ambulatory Visit (INDEPENDENT_AMBULATORY_CARE_PROVIDER_SITE_OTHER): Payer: BC Managed Care – PPO | Admitting: Ophthalmology

## 2013-12-20 ENCOUNTER — Ambulatory Visit (INDEPENDENT_AMBULATORY_CARE_PROVIDER_SITE_OTHER): Payer: BC Managed Care – PPO | Admitting: Ophthalmology

## 2013-12-20 DIAGNOSIS — H43819 Vitreous degeneration, unspecified eye: Secondary | ICD-10-CM

## 2013-12-20 DIAGNOSIS — H35379 Puckering of macula, unspecified eye: Secondary | ICD-10-CM

## 2013-12-20 DIAGNOSIS — I10 Essential (primary) hypertension: Secondary | ICD-10-CM

## 2013-12-20 DIAGNOSIS — H2700 Aphakia, unspecified eye: Secondary | ICD-10-CM

## 2013-12-20 DIAGNOSIS — H35039 Hypertensive retinopathy, unspecified eye: Secondary | ICD-10-CM

## 2014-09-26 ENCOUNTER — Ambulatory Visit (INDEPENDENT_AMBULATORY_CARE_PROVIDER_SITE_OTHER): Payer: BC Managed Care – PPO | Admitting: Ophthalmology

## 2014-10-24 ENCOUNTER — Ambulatory Visit (INDEPENDENT_AMBULATORY_CARE_PROVIDER_SITE_OTHER): Payer: Self-pay | Admitting: Ophthalmology

## 2014-10-31 ENCOUNTER — Ambulatory Visit (INDEPENDENT_AMBULATORY_CARE_PROVIDER_SITE_OTHER): Payer: BLUE CROSS/BLUE SHIELD | Admitting: Ophthalmology

## 2014-10-31 DIAGNOSIS — H35372 Puckering of macula, left eye: Secondary | ICD-10-CM

## 2014-10-31 DIAGNOSIS — H35033 Hypertensive retinopathy, bilateral: Secondary | ICD-10-CM | POA: Diagnosis not present

## 2014-10-31 DIAGNOSIS — I10 Essential (primary) hypertension: Secondary | ICD-10-CM | POA: Diagnosis not present

## 2014-10-31 DIAGNOSIS — H3531 Nonexudative age-related macular degeneration: Secondary | ICD-10-CM

## 2014-10-31 DIAGNOSIS — H43813 Vitreous degeneration, bilateral: Secondary | ICD-10-CM | POA: Diagnosis not present

## 2014-10-31 DIAGNOSIS — H338 Other retinal detachments: Secondary | ICD-10-CM | POA: Diagnosis not present

## 2015-08-01 ENCOUNTER — Ambulatory Visit (INDEPENDENT_AMBULATORY_CARE_PROVIDER_SITE_OTHER): Payer: BLUE CROSS/BLUE SHIELD | Admitting: Ophthalmology

## 2015-08-22 ENCOUNTER — Ambulatory Visit (INDEPENDENT_AMBULATORY_CARE_PROVIDER_SITE_OTHER): Payer: BLUE CROSS/BLUE SHIELD | Admitting: Ophthalmology

## 2015-08-22 DIAGNOSIS — I10 Essential (primary) hypertension: Secondary | ICD-10-CM | POA: Diagnosis not present

## 2015-08-22 DIAGNOSIS — H35033 Hypertensive retinopathy, bilateral: Secondary | ICD-10-CM

## 2015-08-22 DIAGNOSIS — H338 Other retinal detachments: Secondary | ICD-10-CM | POA: Diagnosis not present

## 2015-08-22 DIAGNOSIS — H35372 Puckering of macula, left eye: Secondary | ICD-10-CM

## 2015-08-22 DIAGNOSIS — H353131 Nonexudative age-related macular degeneration, bilateral, early dry stage: Secondary | ICD-10-CM

## 2015-09-06 DIAGNOSIS — H35372 Puckering of macula, left eye: Secondary | ICD-10-CM | POA: Diagnosis not present

## 2015-09-06 DIAGNOSIS — H43811 Vitreous degeneration, right eye: Secondary | ICD-10-CM | POA: Diagnosis not present

## 2015-09-06 DIAGNOSIS — H43391 Other vitreous opacities, right eye: Secondary | ICD-10-CM | POA: Diagnosis not present

## 2015-10-11 DIAGNOSIS — E782 Mixed hyperlipidemia: Secondary | ICD-10-CM | POA: Diagnosis not present

## 2015-10-11 DIAGNOSIS — I10 Essential (primary) hypertension: Secondary | ICD-10-CM | POA: Diagnosis not present

## 2015-10-11 DIAGNOSIS — R739 Hyperglycemia, unspecified: Secondary | ICD-10-CM | POA: Diagnosis not present

## 2015-10-17 DIAGNOSIS — M1000 Idiopathic gout, unspecified site: Secondary | ICD-10-CM | POA: Diagnosis not present

## 2015-10-17 DIAGNOSIS — Z6831 Body mass index (BMI) 31.0-31.9, adult: Secondary | ICD-10-CM | POA: Diagnosis not present

## 2015-10-17 DIAGNOSIS — Z1389 Encounter for screening for other disorder: Secondary | ICD-10-CM | POA: Diagnosis not present

## 2015-10-17 DIAGNOSIS — I10 Essential (primary) hypertension: Secondary | ICD-10-CM | POA: Diagnosis not present

## 2015-10-17 DIAGNOSIS — E782 Mixed hyperlipidemia: Secondary | ICD-10-CM | POA: Diagnosis not present

## 2015-12-10 DIAGNOSIS — H35033 Hypertensive retinopathy, bilateral: Secondary | ICD-10-CM | POA: Diagnosis not present

## 2016-03-09 DIAGNOSIS — I10 Essential (primary) hypertension: Secondary | ICD-10-CM | POA: Diagnosis not present

## 2016-03-09 DIAGNOSIS — M1000 Idiopathic gout, unspecified site: Secondary | ICD-10-CM | POA: Diagnosis not present

## 2016-03-09 DIAGNOSIS — E782 Mixed hyperlipidemia: Secondary | ICD-10-CM | POA: Diagnosis not present

## 2016-03-09 DIAGNOSIS — R739 Hyperglycemia, unspecified: Secondary | ICD-10-CM | POA: Diagnosis not present

## 2016-03-13 DIAGNOSIS — I10 Essential (primary) hypertension: Secondary | ICD-10-CM | POA: Diagnosis not present

## 2016-03-13 DIAGNOSIS — M1000 Idiopathic gout, unspecified site: Secondary | ICD-10-CM | POA: Diagnosis not present

## 2016-03-13 DIAGNOSIS — E875 Hyperkalemia: Secondary | ICD-10-CM | POA: Diagnosis not present

## 2016-03-13 DIAGNOSIS — E782 Mixed hyperlipidemia: Secondary | ICD-10-CM | POA: Diagnosis not present

## 2016-04-04 DIAGNOSIS — Z6827 Body mass index (BMI) 27.0-27.9, adult: Secondary | ICD-10-CM | POA: Diagnosis not present

## 2016-04-04 DIAGNOSIS — J019 Acute sinusitis, unspecified: Secondary | ICD-10-CM | POA: Diagnosis not present

## 2016-05-04 DIAGNOSIS — Z6827 Body mass index (BMI) 27.0-27.9, adult: Secondary | ICD-10-CM | POA: Diagnosis not present

## 2016-05-04 DIAGNOSIS — E875 Hyperkalemia: Secondary | ICD-10-CM | POA: Diagnosis not present

## 2016-05-04 DIAGNOSIS — J019 Acute sinusitis, unspecified: Secondary | ICD-10-CM | POA: Diagnosis not present

## 2016-05-04 DIAGNOSIS — Z6828 Body mass index (BMI) 28.0-28.9, adult: Secondary | ICD-10-CM | POA: Diagnosis not present

## 2016-05-25 ENCOUNTER — Ambulatory Visit (INDEPENDENT_AMBULATORY_CARE_PROVIDER_SITE_OTHER): Payer: BLUE CROSS/BLUE SHIELD | Admitting: Ophthalmology

## 2016-05-27 ENCOUNTER — Ambulatory Visit (INDEPENDENT_AMBULATORY_CARE_PROVIDER_SITE_OTHER): Payer: BLUE CROSS/BLUE SHIELD | Admitting: Ophthalmology

## 2016-05-27 DIAGNOSIS — H43813 Vitreous degeneration, bilateral: Secondary | ICD-10-CM | POA: Diagnosis not present

## 2016-05-27 DIAGNOSIS — I10 Essential (primary) hypertension: Secondary | ICD-10-CM

## 2016-05-27 DIAGNOSIS — H35373 Puckering of macula, bilateral: Secondary | ICD-10-CM | POA: Diagnosis not present

## 2016-05-27 DIAGNOSIS — H338 Other retinal detachments: Secondary | ICD-10-CM | POA: Diagnosis not present

## 2016-05-27 DIAGNOSIS — H35033 Hypertensive retinopathy, bilateral: Secondary | ICD-10-CM | POA: Diagnosis not present

## 2016-09-08 DIAGNOSIS — E782 Mixed hyperlipidemia: Secondary | ICD-10-CM | POA: Diagnosis not present

## 2016-09-08 DIAGNOSIS — E875 Hyperkalemia: Secondary | ICD-10-CM | POA: Diagnosis not present

## 2016-09-08 DIAGNOSIS — I10 Essential (primary) hypertension: Secondary | ICD-10-CM | POA: Diagnosis not present

## 2016-09-08 DIAGNOSIS — R739 Hyperglycemia, unspecified: Secondary | ICD-10-CM | POA: Diagnosis not present

## 2016-09-11 DIAGNOSIS — E782 Mixed hyperlipidemia: Secondary | ICD-10-CM | POA: Diagnosis not present

## 2016-09-11 DIAGNOSIS — Z6829 Body mass index (BMI) 29.0-29.9, adult: Secondary | ICD-10-CM | POA: Diagnosis not present

## 2016-09-11 DIAGNOSIS — M1000 Idiopathic gout, unspecified site: Secondary | ICD-10-CM | POA: Diagnosis not present

## 2016-09-11 DIAGNOSIS — I10 Essential (primary) hypertension: Secondary | ICD-10-CM | POA: Diagnosis not present

## 2017-02-08 DIAGNOSIS — M1000 Idiopathic gout, unspecified site: Secondary | ICD-10-CM | POA: Diagnosis not present

## 2017-02-08 DIAGNOSIS — I10 Essential (primary) hypertension: Secondary | ICD-10-CM | POA: Diagnosis not present

## 2017-02-08 DIAGNOSIS — E782 Mixed hyperlipidemia: Secondary | ICD-10-CM | POA: Diagnosis not present

## 2017-02-08 DIAGNOSIS — E875 Hyperkalemia: Secondary | ICD-10-CM | POA: Diagnosis not present

## 2017-02-12 DIAGNOSIS — E875 Hyperkalemia: Secondary | ICD-10-CM | POA: Diagnosis not present

## 2017-02-12 DIAGNOSIS — I10 Essential (primary) hypertension: Secondary | ICD-10-CM | POA: Diagnosis not present

## 2017-02-12 DIAGNOSIS — E782 Mixed hyperlipidemia: Secondary | ICD-10-CM | POA: Diagnosis not present

## 2017-02-12 DIAGNOSIS — M1000 Idiopathic gout, unspecified site: Secondary | ICD-10-CM | POA: Diagnosis not present

## 2017-03-03 ENCOUNTER — Ambulatory Visit (INDEPENDENT_AMBULATORY_CARE_PROVIDER_SITE_OTHER): Payer: BLUE CROSS/BLUE SHIELD | Admitting: Ophthalmology

## 2017-03-03 DIAGNOSIS — H338 Other retinal detachments: Secondary | ICD-10-CM

## 2017-03-03 DIAGNOSIS — H35372 Puckering of macula, left eye: Secondary | ICD-10-CM

## 2017-03-03 DIAGNOSIS — I10 Essential (primary) hypertension: Secondary | ICD-10-CM

## 2017-03-03 DIAGNOSIS — H35033 Hypertensive retinopathy, bilateral: Secondary | ICD-10-CM | POA: Diagnosis not present

## 2017-03-03 DIAGNOSIS — H43813 Vitreous degeneration, bilateral: Secondary | ICD-10-CM

## 2017-04-15 DIAGNOSIS — Z6832 Body mass index (BMI) 32.0-32.9, adult: Secondary | ICD-10-CM | POA: Diagnosis not present

## 2017-04-15 DIAGNOSIS — L309 Dermatitis, unspecified: Secondary | ICD-10-CM | POA: Diagnosis not present

## 2017-05-07 DIAGNOSIS — Z6833 Body mass index (BMI) 33.0-33.9, adult: Secondary | ICD-10-CM | POA: Diagnosis not present

## 2017-05-07 DIAGNOSIS — R55 Syncope and collapse: Secondary | ICD-10-CM | POA: Diagnosis not present

## 2017-05-07 DIAGNOSIS — I4891 Unspecified atrial fibrillation: Secondary | ICD-10-CM | POA: Diagnosis not present

## 2017-05-13 DIAGNOSIS — I4891 Unspecified atrial fibrillation: Secondary | ICD-10-CM | POA: Diagnosis not present

## 2017-05-13 DIAGNOSIS — R55 Syncope and collapse: Secondary | ICD-10-CM | POA: Diagnosis not present

## 2017-05-13 DIAGNOSIS — I517 Cardiomegaly: Secondary | ICD-10-CM | POA: Diagnosis not present

## 2017-05-27 ENCOUNTER — Ambulatory Visit: Payer: BLUE CROSS/BLUE SHIELD | Admitting: Cardiovascular Disease

## 2017-05-27 ENCOUNTER — Encounter: Payer: Self-pay | Admitting: Cardiovascular Disease

## 2017-05-27 ENCOUNTER — Encounter: Payer: Self-pay | Admitting: *Deleted

## 2017-05-27 VITALS — BP 122/72 | HR 70 | Ht 72.0 in | Wt 245.0 lb

## 2017-05-27 DIAGNOSIS — R55 Syncope and collapse: Secondary | ICD-10-CM | POA: Diagnosis not present

## 2017-05-27 DIAGNOSIS — I480 Paroxysmal atrial fibrillation: Secondary | ICD-10-CM | POA: Diagnosis not present

## 2017-05-27 DIAGNOSIS — Z9289 Personal history of other medical treatment: Secondary | ICD-10-CM | POA: Diagnosis not present

## 2017-05-27 DIAGNOSIS — I10 Essential (primary) hypertension: Secondary | ICD-10-CM

## 2017-05-27 MED ORDER — DILTIAZEM HCL 30 MG PO TABS: 30.0000 mg | ORAL_TABLET | ORAL | 2 refills | Status: AC | PRN

## 2017-05-27 NOTE — Progress Notes (Signed)
CARDIOLOGY CONSULT NOTE  Patient ID: Nicholas Oneill MRN: [id number] DOB/AGE: 1956-03-02 62 y.o.  Admit date: (Not on file) Primary Physician: Selinda Flavin, MD Referring Physician: Roma Kayser PA-C  Reason for Consultation: Atrial fibrillation and syncope  HPI: Nicholas Oneill is a 62 y.o. male who is being seen today for the evaluation of atrial fibrillation and syncope at the request of Roma Kayser PA-C.   He has a history dating back to at least 2012 of atrial fibrillation.  I reviewed a discharge summary from February 2012 which showed that he developed postoperative rapid atrial fibrillation after right eye retinal detachment surgery.  I personally reviewed an ECG performed on 05/07/17 which demonstrated sinus rhythm with no ischemic ST segment or T wave abnormalities, nor any arrhythmias.  He was apparently evaluated in the ED in [place] at [facility] for syncope and was found to be in atrial fibrillation.  I reviewed labs performed on 05/07/17: White blood cell 7.5, hemoglobin 15.4, platelets 314, BUN 14, creatinine 1.07, sodium 145, potassium 5.1, TSH 2.  Heart rate in the ED was apparently 188 bpm.  It appears he did not actually lose consciousness but almost passed out and felt dizzy with palpitations.  There is no associated chest pain.  I reviewed all relevant documentation from the ED evaluation.  He was markedly hypertensive on 05/05/17, 191/96.  Oxygen saturations were in the high 90% range.  He was started on long-acting diltiazem.  Troponins were normal.  Chest x-ray showed no acute cardiopulmonary disease.  He tells me he was standing at work and was 2 minutes from punching out leaning against a wall when he began to experience blurry vision and palpitations.  He does not appear he actually lost consciousness.  He denies antecedent chest pain or shortness of breath.  He said he really had not experienced any symptoms like this since 2012.  He did have another  episode late one night a few days ago but the episode lasted seconds.  He said he was sitting on the toilet and his vision became cloudy.  He denies leg swelling, orthopnea, and paroxysmal nocturnal dyspnea.  He denies a history of sleep apnea.  It appears an echocardiogram was performed.  I will have to request a copy.     Allergies  Allergen Reactions  . Other Itching    YELLOW DYE     Current Outpatient Medications  Medication Sig Dispense Refill  . colchicine-probenecid 0.5-500 MG tablet Take 1 tablet by mouth 2 (two) times daily.    Marland Kitchen diltiazem (DILACOR XR) 240 MG 24 hr capsule Take 240 mg by mouth daily.    Marland Kitchen ibuprofen (ADVIL,MOTRIN) 800 MG tablet Take 800 mg by mouth every 8 (eight) hours as needed.    . Multiple Vitamin (MULTIVITAMIN) tablet Take 1 tablet by mouth daily.       No current facility-administered medications for this visit.     Past Medical History:  Diagnosis Date  . Atrial fibrillation (HCC)   . Dyslipidemia   . Gout   . Hypertension     Past Surgical History:  Procedure Laterality Date  . KNEE SURGERY     As a child  . VITRECTOMY     Left and right eye    Social History   Socioeconomic History  . Marital status: Married    Spouse name: Not on file  . Number of children: Not on file  . Years of education: Not  on file  . Highest education level: Not on file  Social Needs  . Financial resource strain: Not on file  . Food insecurity - worry: Not on file  . Food insecurity - inability: Not on file  . Transportation needs - medical: Not on file  . Transportation needs - non-medical: Not on file  Occupational History  . Not on file  Tobacco Use  . Smoking status: Never Smoker  . Smokeless tobacco: Never Used  Substance and Sexual Activity  . Alcohol use: Yes    Comment: Occasionally  . Drug use: Yes    Comment: Occasionally  . Sexual activity: Not on file  Other Topics Concern  . Not on file  Social History Narrative  . Not on file      No family history of premature CAD in 1st degree relatives.  Current Meds  Medication Sig  . colchicine-probenecid 0.5-500 MG tablet Take 1 tablet by mouth 2 (two) times daily.  Marland Kitchen. diltiazem (DILACOR XR) 240 MG 24 hr capsule Take 240 mg by mouth daily.  Marland Kitchen. ibuprofen (ADVIL,MOTRIN) 800 MG tablet Take 800 mg by mouth every 8 (eight) hours as needed.  . Multiple Vitamin (MULTIVITAMIN) tablet Take 1 tablet by mouth daily.        Review of systems complete and found to be negative unless listed above in HPI    Physical exam Blood pressure 122/72, pulse 70, height 6' (1.829 m), weight 245 lb (111.1 kg), SpO2 98 %. General: NAD Neck: No JVD, no thyromegaly or thyroid nodule.  Lungs: Clear to auscultation bilaterally with normal respiratory effort. CV: Nondisplaced PMI. Regular rate and rhythm, normal S1/S2, no S3/S4, no murmur.  No peripheral edema.  No carotid bruit.    Abdomen: Soft, nontender, no distention.  Skin: Intact without lesions or rashes.  Neurologic: Alert and oriented x 3.  Psych: Normal affect. Extremities: No clubbing or cyanosis.  HEENT: Normal.   ECG: Most recent ECG reviewed.   Labs: Lab Results  Component Value Date/Time   K 4.3 06/03/2010 11:44 PM   BUN 16 06/03/2010 11:44 PM   CREATININE 1.33 06/03/2010 11:44 PM   ALT 26 03/27/2008 12:29 PM   HGB 16.5 06/03/2010 10:19 AM     Lipids: No results found for: LDLCALC, LDLDIRECT, CHOL, TRIG, HDL      ASSESSMENT AND PLAN:  1.  Paroxysmal atrial fibrillation: Symptomatically stable on long-acting diltiazem 240 mg.  It appears he has been on this dose for quite some time preceding the ED evaluation.  CHADSVASC score is 1 based on hypertension, indicating a low thromboembolic risk.  I have asked him to start taking aspirin 81 mg daily. [facility].  I will request a copy of this report for personal review. I will prescribe short acting diltiazem 30 mg  tablets to be taken as needed for sustained tachycardia/palpitations of 5 minutes or longer.  I also spoke to him about the necessity of starting systemic anticoagulation once he turns 62 years old.  I also explained the possibility of atrial fibrillation ablation.  2. Hypertension: Blood pressure is controlled.  No changes.    Disposition: Follow up in 3 months  Signed: Prentice DockerSuresh Diannie Willner, M.D., F.A.C.C.  05/27/2017, 8:47 AM

## 2017-05-27 NOTE — Patient Instructions (Signed)
Medication Instructions:   Begin Diltiazem 30mg  (short acting) as needed palpitations.  Continue all other medications.    Labwork: none  Testing/Procedures: none  Follow-Up: 3 months   Any Other Special Instructions Will Be Listed Below (If Applicable).  If you need a refill on your cardiac medications before your next appointment, please call your pharmacy.

## 2017-06-02 DIAGNOSIS — I480 Paroxysmal atrial fibrillation: Secondary | ICD-10-CM | POA: Diagnosis not present

## 2017-06-02 DIAGNOSIS — Z6833 Body mass index (BMI) 33.0-33.9, adult: Secondary | ICD-10-CM | POA: Diagnosis not present

## 2017-06-29 DIAGNOSIS — R739 Hyperglycemia, unspecified: Secondary | ICD-10-CM | POA: Diagnosis not present

## 2017-06-29 DIAGNOSIS — E782 Mixed hyperlipidemia: Secondary | ICD-10-CM | POA: Diagnosis not present

## 2017-06-29 DIAGNOSIS — I480 Paroxysmal atrial fibrillation: Secondary | ICD-10-CM | POA: Diagnosis not present

## 2017-06-29 DIAGNOSIS — I10 Essential (primary) hypertension: Secondary | ICD-10-CM | POA: Diagnosis not present

## 2017-07-02 DIAGNOSIS — E782 Mixed hyperlipidemia: Secondary | ICD-10-CM | POA: Diagnosis not present

## 2017-07-02 DIAGNOSIS — I10 Essential (primary) hypertension: Secondary | ICD-10-CM | POA: Diagnosis not present

## 2017-07-02 DIAGNOSIS — I480 Paroxysmal atrial fibrillation: Secondary | ICD-10-CM | POA: Diagnosis not present

## 2017-07-02 DIAGNOSIS — R739 Hyperglycemia, unspecified: Secondary | ICD-10-CM | POA: Diagnosis not present

## 2017-07-12 DIAGNOSIS — J111 Influenza due to unidentified influenza virus with other respiratory manifestations: Secondary | ICD-10-CM | POA: Diagnosis not present

## 2017-07-12 DIAGNOSIS — Z6833 Body mass index (BMI) 33.0-33.9, adult: Secondary | ICD-10-CM | POA: Diagnosis not present

## 2017-09-06 ENCOUNTER — Ambulatory Visit: Payer: BLUE CROSS/BLUE SHIELD | Admitting: Cardiovascular Disease

## 2017-09-06 ENCOUNTER — Encounter: Payer: Self-pay | Admitting: Cardiovascular Disease

## 2017-09-06 VITALS — BP 158/82 | HR 76 | Ht 72.0 in | Wt 247.0 lb

## 2017-09-06 DIAGNOSIS — I480 Paroxysmal atrial fibrillation: Secondary | ICD-10-CM | POA: Diagnosis not present

## 2017-09-06 DIAGNOSIS — I10 Essential (primary) hypertension: Secondary | ICD-10-CM

## 2017-09-06 NOTE — Patient Instructions (Signed)

## 2017-09-06 NOTE — Progress Notes (Signed)
SUBJECTIVE: The patient presents for follow-up of paroxysmal atrial fibrillation.  He has been doing very well recently.  He recently retired from [employer] and his stress levels have considerably decreased.  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  He denies chest pain, palpitations, leg swelling, and shortness of breath.  His wife who is a Runner, broadcasting/film/video is also going to retire this year.  He has hobbies such as NASCAR and collects things related to this.  He tells me his blood pressure at his PCPs office was 138/76 recently.  Echocardiogram performed on 05/13/2017 at [facility] demonstrated normal left ventricular systolic function, LVEF 60 to 16%, mild LVH, and mild left atrial enlargement.    Review of Systems: As per "subjective", otherwise negative.  Allergies  Allergen Reactions  . Other Itching    YELLOW DYE     Current Outpatient Medications  Medication Sig Dispense Refill  . colchicine-probenecid 0.5-500 MG tablet Take 1 tablet by mouth 2 (two) times daily.    Marland Kitchen diltiazem (CARDIZEM) 30 MG tablet Take 1 tablet (30 mg total) by mouth as needed (palpitaitons). 30 tablet 2  . diltiazem (DILACOR XR) 240 MG 24 hr capsule Take 240 mg by mouth daily.    Marland Kitchen ibuprofen (ADVIL,MOTRIN) 800 MG tablet Take 800 mg by mouth every 8 (eight) hours as needed.    . Multiple Vitamin (MULTIVITAMIN) tablet Take 1 tablet by mouth daily.       No current facility-administered medications for this visit.     Past Medical History:  Diagnosis Date  . Atrial fibrillation (HCC)   . Dyslipidemia   . Gout   . Hypertension     Past Surgical History:  Procedure Laterality Date  . KNEE SURGERY     As a child  . VITRECTOMY     Left and right eye    Social History   Socioeconomic History  . Marital status: Married    Spouse name: Not on file  .  Number of children: Not on file  . Years of education: Not on file  . Highest education level: Not on file  Occupational History  . Not on file  Social Needs  . Financial resource strain: Not on file  . Food insecurity:    Worry: Not on file    Inability: Not on file  . Transportation needs:    Medical: Not on file    Non-medical: Not on file  Tobacco Use  . Smoking status: Never Smoker  . Smokeless tobacco: Never Used  Substance and Sexual Activity  . Alcohol use: Yes    Comment: Occasionally  . Drug use: Yes    Comment: Occasionally  . Sexual activity: Not on file  Lifestyle  . Physical activity:    Days per week: Not on file    Minutes per session: Not on file  . Stress: Not on file  Relationships  . Social connections:    Talks on phone: Not on file    Gets together: Not on file    Attends religious service: Not on file    Active member of club or organization: Not on file    Attends meetings of clubs or organizations: Not on file    Relationship status: Not on file  . Intimate partner violence:    Fear of  current or ex partner: Not on file    Emotionally abused: Not on file    Physically abused: Not on file    Forced sexual activity: Not on file  Other Topics Concern  . Not on file  Social History Narrative  . Not on file     Vitals:   09/06/17 0817  BP: (!) 158/82  Pulse: 76  SpO2: 98%  Weight: 247 lb (112 kg)  Height: 6' (1.829 m)    Wt Readings from Last 3 Encounters:  09/06/17 247 lb (112 kg)  05/27/17 245 lb (111.1 kg)  02/02/11 234 lb (106.1 kg)     PHYSICAL EXAM General: NAD HEENT: Normal. Neck: No JVD, no thyromegaly. Lungs: Clear to auscultation bilaterally with normal respiratory effort. CV: Regular rate and rhythm, normal S1/S2, no S3/S4, no murmur. No pretibial or periankle edema.     Abdomen: Soft, nontender, no distention.  Neurologic: Alert and oriented.  Psych: Normal affect. Skin: Normal. Musculoskeletal: No gross  deformities.    ECG: Most recent ECG reviewed.   Labs: Lab Results  Component Value Date/Time   K 4.3 06/03/2010 11:44 PM   BUN 16 06/03/2010 11:44 PM   CREATININE 1.33 06/03/2010 11:44 PM   ALT 26 03/27/2008 12:29 PM   HGB 16.5 06/03/2010 10:19 AM     Lipids: No results found for: LDLCALC, LDLDIRECT, CHOL, TRIG, HDL     ASSESSMENT AND PLAN:  1.  Paroxysmal atrial fibrillation: Symptomatically stable on long-acting diltiazem 240 mg. CHADSVASC score is 1 based on hypertension, indicating a low thromboembolic risk.  I previously asked him to start taking aspirin 81 mg daily. Left ventricular systolic function is normal.  The left atrium is mildly enlarged. I previously prescribed short acting diltiazem 30 mg tablets to be taken as needed for sustained tachycardia/palpitations of 5 minutes or longer.  I also previously spoke to him about the necessity of starting systemic anticoagulation once he turns 62 years old.  I also explained the possibility of atrial fibrillation ablation.  2. Hypertension: Blood pressure is elevated today but was recently normal at his PCPs office.  This will need continued monitoring.  No changes.     Disposition: Follow up 6 months   Prentice Docker, M.D., F.A.C.C.

## 2017-11-02 DIAGNOSIS — I480 Paroxysmal atrial fibrillation: Secondary | ICD-10-CM | POA: Diagnosis not present

## 2017-11-02 DIAGNOSIS — E782 Mixed hyperlipidemia: Secondary | ICD-10-CM | POA: Diagnosis not present

## 2017-11-02 DIAGNOSIS — I10 Essential (primary) hypertension: Secondary | ICD-10-CM | POA: Diagnosis not present

## 2017-11-02 DIAGNOSIS — M1000 Idiopathic gout, unspecified site: Secondary | ICD-10-CM | POA: Diagnosis not present

## 2017-11-02 DIAGNOSIS — R7302 Impaired glucose tolerance (oral): Secondary | ICD-10-CM | POA: Diagnosis not present

## 2017-11-05 DIAGNOSIS — I480 Paroxysmal atrial fibrillation: Secondary | ICD-10-CM | POA: Diagnosis not present

## 2017-11-05 DIAGNOSIS — E782 Mixed hyperlipidemia: Secondary | ICD-10-CM | POA: Diagnosis not present

## 2017-11-05 DIAGNOSIS — R739 Hyperglycemia, unspecified: Secondary | ICD-10-CM | POA: Diagnosis not present

## 2017-11-05 DIAGNOSIS — I10 Essential (primary) hypertension: Secondary | ICD-10-CM | POA: Diagnosis not present

## 2017-11-05 DIAGNOSIS — Z1331 Encounter for screening for depression: Secondary | ICD-10-CM | POA: Diagnosis not present

## 2017-11-05 DIAGNOSIS — Z1389 Encounter for screening for other disorder: Secondary | ICD-10-CM | POA: Diagnosis not present

## 2017-12-01 ENCOUNTER — Encounter (INDEPENDENT_AMBULATORY_CARE_PROVIDER_SITE_OTHER): Payer: BLUE CROSS/BLUE SHIELD | Admitting: Ophthalmology

## 2017-12-01 DIAGNOSIS — H35033 Hypertensive retinopathy, bilateral: Secondary | ICD-10-CM | POA: Diagnosis not present

## 2017-12-01 DIAGNOSIS — I10 Essential (primary) hypertension: Secondary | ICD-10-CM

## 2017-12-01 DIAGNOSIS — H338 Other retinal detachments: Secondary | ICD-10-CM | POA: Diagnosis not present

## 2017-12-01 DIAGNOSIS — H35372 Puckering of macula, left eye: Secondary | ICD-10-CM | POA: Diagnosis not present

## 2017-12-01 DIAGNOSIS — H43813 Vitreous degeneration, bilateral: Secondary | ICD-10-CM

## 2018-03-15 ENCOUNTER — Ambulatory Visit: Payer: BLUE CROSS/BLUE SHIELD | Admitting: Cardiovascular Disease

## 2018-04-04 DIAGNOSIS — M1000 Idiopathic gout, unspecified site: Secondary | ICD-10-CM | POA: Diagnosis not present

## 2018-04-04 DIAGNOSIS — I480 Paroxysmal atrial fibrillation: Secondary | ICD-10-CM | POA: Diagnosis not present

## 2018-04-04 DIAGNOSIS — I10 Essential (primary) hypertension: Secondary | ICD-10-CM | POA: Diagnosis not present

## 2018-04-04 DIAGNOSIS — E782 Mixed hyperlipidemia: Secondary | ICD-10-CM | POA: Diagnosis not present

## 2018-04-04 DIAGNOSIS — R739 Hyperglycemia, unspecified: Secondary | ICD-10-CM | POA: Diagnosis not present

## 2018-04-08 DIAGNOSIS — I480 Paroxysmal atrial fibrillation: Secondary | ICD-10-CM | POA: Diagnosis not present

## 2018-04-08 DIAGNOSIS — E782 Mixed hyperlipidemia: Secondary | ICD-10-CM | POA: Diagnosis not present

## 2018-04-08 DIAGNOSIS — R739 Hyperglycemia, unspecified: Secondary | ICD-10-CM | POA: Diagnosis not present

## 2018-04-08 DIAGNOSIS — I10 Essential (primary) hypertension: Secondary | ICD-10-CM | POA: Diagnosis not present

## 2018-06-03 ENCOUNTER — Encounter (INDEPENDENT_AMBULATORY_CARE_PROVIDER_SITE_OTHER): Payer: BLUE CROSS/BLUE SHIELD | Admitting: Ophthalmology

## 2018-06-03 DIAGNOSIS — I10 Essential (primary) hypertension: Secondary | ICD-10-CM | POA: Diagnosis not present

## 2018-06-03 DIAGNOSIS — H43813 Vitreous degeneration, bilateral: Secondary | ICD-10-CM

## 2018-06-03 DIAGNOSIS — H35033 Hypertensive retinopathy, bilateral: Secondary | ICD-10-CM | POA: Diagnosis not present

## 2018-06-03 DIAGNOSIS — H338 Other retinal detachments: Secondary | ICD-10-CM | POA: Diagnosis not present

## 2018-06-03 DIAGNOSIS — H35372 Puckering of macula, left eye: Secondary | ICD-10-CM | POA: Diagnosis not present

## 2018-06-07 ENCOUNTER — Ambulatory Visit: Payer: BLUE CROSS/BLUE SHIELD | Admitting: Cardiovascular Disease

## 2018-06-07 ENCOUNTER — Encounter: Payer: Self-pay | Admitting: Cardiovascular Disease

## 2018-06-07 ENCOUNTER — Encounter: Payer: Self-pay | Admitting: *Deleted

## 2018-06-07 VITALS — BP 152/100 | HR 79 | Ht 72.0 in | Wt 266.0 lb

## 2018-06-07 DIAGNOSIS — I10 Essential (primary) hypertension: Secondary | ICD-10-CM | POA: Diagnosis not present

## 2018-06-07 DIAGNOSIS — I480 Paroxysmal atrial fibrillation: Secondary | ICD-10-CM

## 2018-06-07 NOTE — Progress Notes (Signed)
SUBJECTIVE: The patient presents for follow-up of paroxysmal atrial fibrillation.  Echocardiogram performed on 05/13/2017 at [facility] demonstrated normal left ventricular systolic function, LVEF 60 to 67%, mild LVH, and mild left atrial enlargement.  Hemoglobin 15.4 and creatinine 1.07 on 05/07/2017.  TSH was normal.  He has had 2 episodes of palpitations since his last visit with me.  One occurred while he was walking at a flea market in [place], [place].  He felt fatigued for about 2 days.  Another episode occurred when he was eating out with his wife in [place].  He denies chest pain, leg swelling, orthopnea, and shortness of breath.  ECG performed in the office today which I ordered and personally interpreted demonstrates normal sinus rhythm with no ischemic ST segment or T-wave abnormalities, nor any arrhythmias.  He was told by his PCP that he needs to lose weight and said he has lost about 7 pounds in the past 4 weeks.  He is back to riding his bike.  He does not check his blood pressure at home.   Social history: He is retired from [employer] in 2019. He has hobbies such as NASCAR and collects things related to this.  Review of Systems: As per "subjective", otherwise negative.  Allergies  Allergen Reactions  . Other Itching    YELLOW DYE     Current Outpatient Medications  Medication Sig Dispense Refill  . colchicine-probenecid 0.5-500 MG tablet Take 1 tablet by mouth 2 (two) times daily.    Marland Kitchen diltiazem (CARDIZEM) 30 MG tablet Take 1 tablet (30 mg total) by mouth as needed (palpitaitons). 30 tablet 2  . diltiazem (DILACOR XR) 240 MG 24 hr capsule Take 240 mg by mouth daily.    Marland Kitchen ibuprofen (ADVIL,MOTRIN) 800 MG tablet Take 800 mg by mouth every 8 (eight) hours as needed.    . Multiple Vitamin (MULTIVITAMIN) tablet Take 1 tablet by mouth daily.       No current facility-administered medications for this visit.     Past Medical History:  Diagnosis Date  .  Atrial fibrillation (HCC)   . Dyslipidemia   . Gout   . Hypertension     Past Surgical History:  Procedure Laterality Date  . KNEE SURGERY     As a child  . VITRECTOMY     Left and right eye    Social History   Socioeconomic History  . Marital status: Married    Spouse name: Not on file  . Number of children: Not on file  . Years of education: Not on file  . Highest education level: Not on file  Occupational History  . Not on file  Social Needs  . Financial resource strain: Not on file  . Food insecurity:    Worry: Not on file    Inability: Not on file  . Transportation needs:    Medical: Not on file    Non-medical: Not on file  Tobacco Use  . Smoking status: Never Smoker  . Smokeless tobacco: Never Used  Substance and Sexual Activity  . Alcohol use: Yes    Comment: Occasionally  . Drug use: Yes    Comment: Occasionally  . Sexual activity: Not on file  Lifestyle  . Physical activity:    Days per week: Not on file    Minutes per session: Not on file  . Stress: Not on file  Relationships  . Social connections:    Talks on phone: Not on file  Gets together: Not on file    Attends religious service: Not on file    Active member of club or organization: Not on file    Attends meetings of clubs or organizations: Not on file    Relationship status: Not on file  . Intimate partner violence:    Fear of current or ex partner: Not on file    Emotionally abused: Not on file    Physically abused: Not on file    Forced sexual activity: Not on file  Other Topics Concern  . Not on file  Social History Narrative  . Not on file     Vitals:   06/07/18 1510  BP: (!) 152/100  Pulse: 79  SpO2: 98%  Weight: 266 lb (120.7 kg)  Height: 6' (1.829 m)    Wt Readings from Last 3 Encounters:  06/07/18 266 lb (120.7 kg)  09/06/17 247 lb (112 kg)  05/27/17 245 lb (111.1 kg)     PHYSICAL EXAM General: NAD HEENT: Normal. Neck: No JVD, no thyromegaly. Lungs: Clear  to auscultation bilaterally with normal respiratory effort. CV: Regular rate and rhythm, normal S1/S2, no S3/S4, no murmur. No pretibial or periankle edema.  No carotid bruit.   Abdomen: Soft, nontender, no distention.  Neurologic: Alert and oriented.  Psych: Normal affect. Skin: Normal. Musculoskeletal: No gross deformities.    ECG: Reviewed above under Subjective   Labs: Lab Results  Component Value Date/Time   K 4.3 06/03/2010 11:44 PM   BUN 16 06/03/2010 11:44 PM   CREATININE 1.33 06/03/2010 11:44 PM   ALT 26 03/27/2008 12:29 PM   HGB 16.5 06/03/2010 10:19 AM     Lipids: No results found for: LDLCALC, LDLDIRECT, CHOL, TRIG, HDL     ASSESSMENT AND PLAN: 1.  Paroxysmal atrial fibrillation: Symptomatically stable on long-acting diltiazem 240 mg. CHADSVASC score is1based on hypertension, indicating alowthromboembolic risk.  I previously asked him to start taking aspirin 81 mg daily. Left ventricular systolic function is normal.  The left atrium is mildly enlarged. I previously prescribed short acting diltiazem 30 mg tablets to be taken as needed for sustained tachycardia/palpitations of 5 minutes or longer. I also previously spoke to him about the necessity of starting systemic anticoagulation once he turns 63 years old. I also explained the possibility of atrial fibrillation ablation.  2.  Hypertension: Blood pressure is markedly elevated.  I will have my nurse call his PCPs office to see what his last 3 or 4 blood pressures have been over there.  I asked him to have his blood pressure rechecked in a month.  If it remains elevated, I would start lisinopril 5 mg daily.   Disposition: Follow up 1 year.   Prentice Docker, M.D., F.A.C.C.

## 2018-06-07 NOTE — Patient Instructions (Addendum)
Medication Instructions:   Your physician recommends that you continue on your current medications as directed. Please refer to the Current Medication list given to you today.  Labwork:  NONE  Testing/Procedures:  NONE  Follow-Up:  Your physician recommends that you schedule a follow-up appointment in: 1 year. You will receive a reminder letter in the mail in about months 10 reminding you to call and schedule your appointment. If you don't receive this letter, please contact our office.  Any Other Special Instructions Will Be Listed Below (If Applicable).  Please arrange to have your blood pressure checked at your family doctor's office in one month.  If you need a refill on your cardiac medications before your next appointment, please call your pharmacy.

## 2019-06-07 ENCOUNTER — Encounter (INDEPENDENT_AMBULATORY_CARE_PROVIDER_SITE_OTHER): Payer: BLUE CROSS/BLUE SHIELD | Admitting: Ophthalmology

## 2019-06-21 ENCOUNTER — Encounter (INDEPENDENT_AMBULATORY_CARE_PROVIDER_SITE_OTHER): Payer: BC Managed Care – PPO | Admitting: Ophthalmology

## 2019-06-21 ENCOUNTER — Other Ambulatory Visit: Payer: Self-pay

## 2019-06-21 DIAGNOSIS — H35033 Hypertensive retinopathy, bilateral: Secondary | ICD-10-CM

## 2019-06-21 DIAGNOSIS — I10 Essential (primary) hypertension: Secondary | ICD-10-CM | POA: Diagnosis not present

## 2019-06-21 DIAGNOSIS — H338 Other retinal detachments: Secondary | ICD-10-CM

## 2019-06-21 DIAGNOSIS — H35373 Puckering of macula, bilateral: Secondary | ICD-10-CM

## 2019-06-21 DIAGNOSIS — H43813 Vitreous degeneration, bilateral: Secondary | ICD-10-CM

## 2019-08-28 NOTE — Progress Notes (Addendum)
Cardiology Office Note  Date: 08/29/2019   ID: Nicholas Oneill [birth year], MRN [id number]  PCP:  Nicholas Flavin, MD  Cardiologist:  Nicholas Docker, MD Electrophysiologist:  None   Chief Complaint: F/U PAF, HTN, HLD  History of Present Illness: Nicholas Oneill is a 64 y.o. male with a history of PAF, HTN, HLD  Last saw Nicholas Oneill on 06/07/2018.  He was symptomatically stable on long-acting diltiazem with his PAF but had experienced 2 episodes of palpitations in the interim since previous visit.  He described feeling fatigued for approximately 2 days after one episode.  His CHA2DS2-VASc score was 1 based on hypertension.  Nicholas Oneill had previously asked the patient to start aspirin 81 mg daily.    Nicholas Oneill previously prescribed short acting diltiazem 30 mg tablets to be taken as needed for sustained tachycardia/palpitations lasting greater than 5 minutes or longer.  He had previously discussed with patient the fact that once he turned 64 he would need to start systemic anti coagulation.  He had also discussed the possibility of atrial fibrillation ablation Patient's blood pressure was markedly elevated during that visit.The patient was to have his blood pressure rechecked in a month and if it remained elevated he would start lisinopril 5 mg daily. He was riding his bike and had recently lost 7 pounds.  Today he presents with no particular complaints.  He states he has been working out on his stationary bike and Cytogeneticist and has lost approximately 50 pounds.  States he has completely cut out caffeine.  States he feels much better.  He has an occasional episode of brief palpitations.  He states most of the time if he sneezes the palpitations resolve.  States he has not used  his short acting diltiazem in quite some time.  He denies any recent palpitations, PND, orthopnea, orthostatic symptoms, stroke or TIA-like symptoms, or lower extremity edema.  EKG today shows normal  sinus rhythm rate of 69 with 1 PAC.  Past Medical History:  Diagnosis Date  . Atrial fibrillation (HCC)   . Dyslipidemia   . Gout   . Hypertension     Past Surgical History:  Procedure Laterality Date  . KNEE SURGERY     As a child  . VITRECTOMY     Left and right eye    Current Outpatient Medications  Medication Sig Dispense Refill  . aspirin EC 81 MG tablet Take 81 mg by mouth daily.    . bimatoprost (LUMIGAN) 0.01 % SOLN Place 1 drop into the right eye at bedtime.    . brimonidine-timolol (COMBIGAN) 0.2-0.5 % ophthalmic solution Place 1 drop into the right eye every 12 (twelve) hours.    . colchicine-probenecid 0.5-500 MG tablet Take 1 tablet by mouth 2 (two) times daily.    Marland Kitchen diltiazem (CARDIZEM CD) 240 MG 24 hr capsule Take 1 capsule by mouth daily.    Marland Kitchen diltiazem (CARDIZEM) 30 MG tablet Take 1 tablet (30 mg total) by mouth as needed (palpitaitons). 30 tablet 2  . ibuprofen (ADVIL,MOTRIN) 800 MG tablet Take 800 mg by mouth every 8 (eight) hours as needed.    . Multiple Vitamin (MULTIVITAMIN) tablet Take 1 tablet by mouth daily.       No current facility-administered medications for this visit.   Allergies:  Other   Social History: The patient  reports that he has never smoked. He has never used smokeless tobacco. He reports current alcohol use. He reports current drug use.  Family History: The patient's family history includes Heart attack in his father; Heart disease in his brother.   ROS:  Please see the history of present illness. Otherwise, complete review of systems is positive for none.  All other systems are reviewed and negative.   Physical Exam: VS:  BP 140/80   Pulse 64   Ht 6' (1.829 m)   Wt 221 lb 3.2 oz (100.3 kg)   SpO2 98%   BMI 30.00 kg/m , BMI Body mass index is 30 kg/m.  Wt Readings from Last 3 Encounters:  08/29/19 221 lb 3.2 oz (100.3 kg)  06/07/18 266 lb (120.7 kg)  09/06/17 247 lb (112 kg)    General: Patient appears comfortable at  rest. Neck: Supple, no elevated JVP or carotid bruits, no thyromegaly. Lungs: Clear to auscultation, nonlabored breathing at rest. Cardiac: Regular rate and rhythm, no S3 or significant systolic murmur, no pericardial rub. Extremities: No pitting edema, distal pulses 2+. Skin: Warm and dry. Musculoskeletal: No kyphosis. Neuropsychiatric: Alert and oriented x3, affect grossly appropriate.  ECG:  An ECG dated 08/29/2019 was personally reviewed today and demonstrated:  Normal sinus rhythm with occasional PVC rate of 69  Recent Labwork: No results found for requested labs within last 8760 hours.  No results found for: CHOL, TRIG, HDL, CHOLHDL, VLDL, LDLCALC, LDLDIRECT  Other Studies Reviewed Today:   Assessment and Plan:  1. PAF (paroxysmal atrial fibrillation) ([facility])   2. Essential hypertension    1. PAF (paroxysmal atrial fibrillation) [facility]) Patient states he has a brief occasional  episode of palpitations.  He states he has noticed that if he sneezes this breaks the palpitations/arrhythmia.  States he has rarely had to use the short acting diltiazem.  Continue diltiazem 240 mg daily and short acting diltiazem 30 mg as needed for palpitations.  Patient states he has completely cut out caffeine and believes his symptoms have improved since making this change.  2. Essential hypertension  Blood pressure slightly elevated today at 140/80.  He states he has been very active exercising daily using a stationary bike and a Nordic machine and has lost approximately 50 pounds.   Medication Adjustments/Labs and Tests Ordered: Current medicines are reviewed at length with the patient today.  Concerns regarding medicines are outlined above.   Disposition: Follow-up with Dr. Bronson Ing or APP 1 year  Signed, Levell July, NP 08/29/2019 9:32 AM    [facility] [place], [place], [place] [address] Phone: [phone]; Fax: [phone]

## 2019-08-29 ENCOUNTER — Other Ambulatory Visit: Payer: Self-pay

## 2019-08-29 ENCOUNTER — Ambulatory Visit: Payer: BC Managed Care – PPO | Admitting: Family Medicine

## 2019-08-29 ENCOUNTER — Encounter: Payer: Self-pay | Admitting: Family Medicine

## 2019-08-29 VITALS — BP 140/80 | HR 64 | Ht 72.0 in | Wt 221.2 lb

## 2019-08-29 DIAGNOSIS — I10 Essential (primary) hypertension: Secondary | ICD-10-CM | POA: Diagnosis not present

## 2019-08-29 DIAGNOSIS — I480 Paroxysmal atrial fibrillation: Secondary | ICD-10-CM

## 2019-08-29 NOTE — Patient Instructions (Addendum)

## 2020-06-17 ENCOUNTER — Encounter (INDEPENDENT_AMBULATORY_CARE_PROVIDER_SITE_OTHER): Payer: BC Managed Care – PPO | Admitting: Ophthalmology

## 2020-06-20 ENCOUNTER — Encounter (INDEPENDENT_AMBULATORY_CARE_PROVIDER_SITE_OTHER): Payer: BC Managed Care – PPO | Admitting: Ophthalmology

## 2020-07-10 ENCOUNTER — Other Ambulatory Visit: Payer: Self-pay

## 2020-07-10 ENCOUNTER — Encounter (INDEPENDENT_AMBULATORY_CARE_PROVIDER_SITE_OTHER): Payer: Medicare Other | Admitting: Ophthalmology

## 2020-07-10 DIAGNOSIS — H43813 Vitreous degeneration, bilateral: Secondary | ICD-10-CM

## 2020-07-10 DIAGNOSIS — I10 Essential (primary) hypertension: Secondary | ICD-10-CM | POA: Diagnosis not present

## 2020-07-10 DIAGNOSIS — H338 Other retinal detachments: Secondary | ICD-10-CM | POA: Diagnosis not present

## 2020-07-10 DIAGNOSIS — H35033 Hypertensive retinopathy, bilateral: Secondary | ICD-10-CM | POA: Diagnosis not present

## 2020-07-10 DIAGNOSIS — H35373 Puckering of macula, bilateral: Secondary | ICD-10-CM

## 2020-09-02 NOTE — Progress Notes (Addendum)
Cardiology Office Note  Date: 09/03/2020   ID: Nicholas Oneill, Nicholas Oneill March 01, 1956, MRN [id number]  PCP:  Royann Shivers, PA-C  Cardiologist:  Prentice Docker, MD (Inactive) Electrophysiologist:  None   Chief Complaint: 1 year follow up  History of Present Illness: Nicholas Oneill is a 65 y.o. male with a history of PAF, HTN, HLD  Last saw Dr. Purvis Sheffield on 06/07/2018.  He was symptomatically stable on long-acting diltiazem with his PAF but had experienced 2 episodes of palpitations in the interim since previous visit.  He described feeling fatigued for approximately 2 days after one episode.  His CHA2DS2-VASc score was 1 based on hypertension.  Dr. Purvis Sheffield had previously asked the patient to start aspirin 81 mg daily.    Dr. Purvis Sheffield previously prescribed short acting diltiazem 30 mg tablets to be taken as needed for sustained tachycardia/palpitations lasting greater than 5 minutes or longer.  He had previously discussed with patient the fact that once he turned 64 he would need to start systemic anti coagulation.  He had also discussed the possibility of atrial fibrillation ablation Patient's blood pressure was markedly elevated during that visit.The patient was to have his blood pressure rechecked in a month and if it remained elevated he would start lisinopril 5 mg daily. He was riding his bike and had recently lost 7 pounds.  He is here today for 1 year follow-up.  He denies any recent acute illnesses or hospitalizations.  States he is doing well with his palpitations.  He states when he does have palpitations he finds that if he sneezes the palpitations resolved.  We discussed other vagal maneuvers he could try instead of sneezing.  He denies any anginal or exertional symptoms, orthostatic symptoms, lightheadedness, dizziness, or presyncopal or syncopal episodes.  Denies any DOE or SOB.  Denies any PND or orthopnea.  Blood pressure is well controlled today at 118/80.  Heart rate is  64 and regular.  States he had some recent labs a few months ago with primary care provider.  He denies any DVT or PE-like symptoms, or lower extremity edema.  EKG today demonstrates normal sinus rhythm with a rate of 66, nonspecific T wave abnormality.  States he has been having some recent ED issues.  Past Medical History:  Diagnosis Date  . Atrial fibrillation (HCC)   . Dyslipidemia   . Gout   . Hypertension     Past Surgical History:  Procedure Laterality Date  . KNEE SURGERY     As a child  . VITRECTOMY     Left and right eye    Current Outpatient Medications  Medication Sig Dispense Refill  . aspirin EC 81 MG tablet Take 81 mg by mouth daily.    . bimatoprost (LUMIGAN) 0.01 % SOLN Place 1 drop into the right eye at bedtime.    . brimonidine-timolol (COMBIGAN) 0.2-0.5 % ophthalmic solution Place 1 drop into the right eye every 12 (twelve) hours.    . colchicine-probenecid 0.5-500 MG tablet Take 1 tablet by mouth 2 (two) times daily.    Marland Kitchen diltiazem (CARDIZEM CD) 240 MG 24 hr capsule Take 1 capsule by mouth daily.    Marland Kitchen diltiazem (CARDIZEM) 30 MG tablet Take 1 tablet (30 mg total) by mouth as needed (palpitaitons). 30 tablet 2  . ibuprofen (ADVIL,MOTRIN) 800 MG tablet Take 800 mg by mouth every 8 (eight) hours as needed.    . Multiple Vitamin (MULTIVITAMIN) tablet Take 1 tablet by mouth daily.  No current facility-administered medications for this visit.   Allergies:  Other   Social History: The patient  reports that he has never smoked. He has never used smokeless tobacco. He reports current alcohol use. He reports current drug use.   Family History: The patient's family history includes Heart attack in his father; Heart disease in his brother.   ROS:  Please see the history of present illness. Otherwise, complete review of systems is positive for none.  All other systems are reviewed and negative.   Physical Exam: VS:  BP 118/80   Pulse 64   Ht 6' (1.829 m)   Wt 257  lb 6.4 oz (116.8 kg)   SpO2 94%   BMI 34.91 kg/m , BMI Body mass index is 34.91 kg/m.  Wt Readings from Last 3 Encounters:  09/03/20 257 lb 6.4 oz (116.8 kg)  08/29/19 221 lb 3.2 oz (100.3 kg)  06/07/18 266 lb (120.7 kg)    General: Patient appears comfortable at rest. Neck: Supple, no elevated JVP or carotid bruits, no thyromegaly. Lungs: Clear to auscultation, nonlabored breathing at rest. Cardiac: Regular rate and rhythm, no S3 or significant systolic murmur, no pericardial rub. Extremities: No pitting edema, distal pulses 2+. Skin: Warm and dry. Musculoskeletal: No kyphosis. Neuropsychiatric: Alert and oriented x3, affect grossly appropriate.  ECG:  An ECG dated 09/03/2020 was personally reviewed today and demonstrated:  Normal sinus rhythm rate of 66, nonspecific T wave abnormality  Recent Labwork: No results found for requested labs within last 8760 hours.  No results found for: CHOL, TRIG, HDL, CHOLHDL, VLDL, LDLCALC, LDLDIRECT  Other Studies Reviewed Today:   Echocardiogram 05/17/2017 [facility] 1.  Mild concentric LVH 2.  Estimated EF 60 to 65% 3.  Left atrium mildly dilated 4.  RV global systolic function normal  Assessment and Plan:  1. Paroxysmal atrial fibrillation (HCC)   2. Essential hypertension    1. PAF (paroxysmal atrial fibrillation) [facility]) Patient states he has a brief occasional  episode of palpitations.  He states he has noticed that if he sneezes this breaks the palpitations/arrhythmia.  States he has rarely had to use the short acting diltiazem.   Patient states he has completely cut out caffeine and believes his symptoms have improved since making this change.  Continue aspirin 81 mg daily.  Continue diltiazem 240 mg p.o. daily.  Continue diltiazem 30 mg daily as needed for palpitations.  2. Essential hypertension   Blood pressure controlled today 118/80.  At last visit he stated he had been very active exercising daily using a stationary bike and a Nordic  machine and has lost approximately 50 pounds.     Medication Adjustments/Labs and Tests Ordered: Current medicines are reviewed at length with the patient today.  Concerns regarding medicines are outlined above.   Disposition: Follow-up with [facility] or APP 1 year  Signed, Rennis Harding, NP 09/03/2020 11:14 AM    [facility] at [facility] [address] [place], [place], [place] [address] Phone: [phone]; Fax: [phone]

## 2020-09-03 ENCOUNTER — Ambulatory Visit: Payer: Medicare Other | Admitting: Family Medicine

## 2020-09-03 ENCOUNTER — Encounter: Payer: Self-pay | Admitting: Family Medicine

## 2020-09-03 VITALS — BP 118/80 | HR 64 | Ht 72.0 in | Wt 257.4 lb

## 2020-09-03 DIAGNOSIS — I480 Paroxysmal atrial fibrillation: Secondary | ICD-10-CM | POA: Diagnosis not present

## 2020-09-03 DIAGNOSIS — E782 Mixed hyperlipidemia: Secondary | ICD-10-CM

## 2020-09-03 DIAGNOSIS — I10 Essential (primary) hypertension: Secondary | ICD-10-CM

## 2020-09-03 NOTE — Patient Instructions (Addendum)

## 2021-07-14 ENCOUNTER — Encounter (INDEPENDENT_AMBULATORY_CARE_PROVIDER_SITE_OTHER): Payer: Medicare Other | Admitting: Ophthalmology

## 2021-07-16 ENCOUNTER — Encounter (INDEPENDENT_AMBULATORY_CARE_PROVIDER_SITE_OTHER): Payer: Medicare Other | Admitting: Ophthalmology

## 2021-07-16 ENCOUNTER — Other Ambulatory Visit: Payer: Self-pay

## 2021-07-16 DIAGNOSIS — H35033 Hypertensive retinopathy, bilateral: Secondary | ICD-10-CM

## 2021-07-16 DIAGNOSIS — I10 Essential (primary) hypertension: Secondary | ICD-10-CM

## 2021-07-16 DIAGNOSIS — H338 Other retinal detachments: Secondary | ICD-10-CM

## 2021-07-16 DIAGNOSIS — H35373 Puckering of macula, bilateral: Secondary | ICD-10-CM

## 2021-07-16 DIAGNOSIS — H43813 Vitreous degeneration, bilateral: Secondary | ICD-10-CM

## 2021-11-26 ENCOUNTER — Ambulatory Visit: Payer: Medicare Other | Admitting: Cardiology

## 2021-11-26 ENCOUNTER — Encounter: Payer: Self-pay | Admitting: Cardiology

## 2021-11-26 ENCOUNTER — Encounter: Payer: Self-pay | Admitting: *Deleted

## 2021-11-26 VITALS — BP 132/84 | HR 98 | Ht 72.0 in | Wt 251.6 lb

## 2021-11-26 DIAGNOSIS — I480 Paroxysmal atrial fibrillation: Secondary | ICD-10-CM

## 2021-11-26 MED ORDER — APIXABAN 5 MG PO TABS: 5.0000 mg | ORAL_TABLET | Freq: Two times a day (BID) | ORAL | 6 refills | Status: DC

## 2021-11-26 NOTE — Progress Notes (Signed)
Cardiology Office Note:    Date:  11/26/2021   ID:  Nicholas Oneill, Nicholas Oneill [birth year], MRN [id number]  PCP:  Sheela Stack   [facility] Providers Cardiologist:  Prentice Docker, MD (Inactive)     Referring MD: Royann Shivers, *    History of Present Illness:    Nicholas Oneill is a 66 y.o. male here for follow-up paroxysmal atrial fibrillation hypertension hyperlipidemia.  Interestingly, he is in atrial fibrillation today.  Unaware.  Previously he would state that he would sometimes sneeze and it would break his arrhythmia and he would feel better.  Has been on long-acting diltiazem for his paroxysmal atrial fibrillation.  According to prior visit in 2022 had had 2 prior episodes of likely atrial fibrillation since 2020.  Felt fatigued during these episodes.  Had been on aspirin 81 mg because of his CHA2DS2-VASc score at the time of 1.  Once he turns 64 he would need anticoagulation.  The discussion of atrial fibrillation ablation was also had with patient.  Prior EKG showed normal sinus rhythm with rate of 66 with nonspecific ST-T wave changes.  He had some erectile dysfunction issues in the past as well.  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  Past Medical History:  Diagnosis Date   Atrial fibrillation (HCC)    Dyslipidemia    Gout    Hypertension     Past Surgical History:  Procedure Laterality Date   KNEE SURGERY     As a child   VITRECTOMY     Left and right eye    Current Medications: Current Meds  Medication Sig   apixaban (ELIQUIS) 5 MG TABS tablet Take 1 tablet (5 mg total) by mouth 2 (two) times daily.   bimatoprost (LUMIGAN) 0.01 % SOLN Place 1 drop into the right eye at bedtime.   brimonidine-timolol (COMBIGAN) 0.2-0.5 % ophthalmic solution Place 1 drop into the right eye every 12 (twelve) hours.   colchicine-probenecid 0.5-500 MG tablet Take 1 tablet by mouth 2  (two) times daily.   diltiazem (CARDIZEM CD) 240 MG 24 hr capsule Take 1 capsule by mouth daily.   diltiazem (CARDIZEM) 30 MG tablet Take 1 tablet (30 mg total) by mouth as needed (palpitaitons).   ibuprofen (ADVIL,MOTRIN) 800 MG tablet Take 800 mg by mouth every 8 (eight) hours as needed.   Multiple Vitamin (MULTIVITAMIN) tablet Take 1 tablet by mouth daily.   [DISCONTINUED] aspirin EC 81 MG tablet Take 81 mg by mouth daily.     Allergies:   Other   Social History   Socioeconomic History   Marital status: Married    Spouse name: Not on file   Number of children: Not on file   Years of education: Not on file   Highest education level: Not on file  Occupational History   Not on file  Tobacco Use   Smoking status: Never   Smokeless tobacco: Never  Vaping Use   Vaping Use: Never used  Substance and Sexual Activity   Alcohol use: Yes    Comment: Occasionally   Drug use: Yes    Comment: Occasionally   Sexual activity: Not on file  Other Topics Concern   Not on file  Social History Narrative   Not on file   Social Determinants of Health   Financial Resource Strain: Not on file  Food Insecurity: Not on file  Transportation Needs:  Not on file  Physical Activity: Not on file  Stress: Not on file  Social Connections: Not on file     Family History: The patient's family history includes Heart attack in his father; Heart disease in his brother.  ROS:   Please see the history of present illness.     All other systems reviewed and are negative.  EKGs/Labs/Other Studies Reviewed:    The following studies were reviewed today:   Echocardiogram 05/17/2017 [facility] 1.  Mild concentric LVH 2.  Estimated EF 60 to 65% 3.  Left atrium mildly dilated 4.  RV global systolic function normal  EKG:  EKG is  ordered today.  The ekg ordered today demonstrates atrial fibrillation 98 bpm  Recent Labs: No results found for requested labs within last 365 days.  Recent Lipid Panel No  results found for: "CHOL", "TRIG", "HDL", "CHOLHDL", "VLDL", "LDLCALC", "LDLDIRECT"   Risk Assessment/Calculations:    CHA2DS2-VASc Score = 2   This indicates a 2.2% annual risk of stroke. The patient's score is based upon: CHF History: 0 HTN History: 1 Diabetes History: 0 Stroke History: 0 Vascular Disease History: 0 Age Score: 1 Gender Score: 0              Physical Exam:    VS:  BP 132/84   Pulse 98   Ht 6' (1.829 m)   Wt 251 lb 9.6 oz (114.1 kg)   SpO2 97%   BMI 34.12 kg/m     Wt Readings from Last 3 Encounters:  11/26/21 251 lb 9.6 oz (114.1 kg)  09/03/20 257 lb 6.4 oz (116.8 kg)  08/29/19 221 lb 3.2 oz (100.3 kg)     GEN:  Well nourished, well developed in no acute distress HEENT: Normal NECK: No JVD; No carotid bruits LYMPHATICS: No lymphadenopathy CARDIAC: Irregularly irregular , no murmurs, no rubs, gallops RESPIRATORY:  Clear to auscultation without rales, wheezing or rhonchi  ABDOMEN: Soft, non-tender, non-distended MUSCULOSKELETAL:  No edema; No deformity  SKIN: Warm and dry NEUROLOGIC:  Alert and oriented x 3 PSYCHIATRIC:  Normal affect   ASSESSMENT:    1. Paroxysmal atrial fibrillation (HCC)    PLAN:    In order of problems listed above:  Paroxysmal atrial fibrillation -EKG today shows atrial fibrillation heart rate 98 bpm.  Interestingly, he was not aware that he was in atrial fibrillation.  He says over the last 5 months he has felt the best he has felt in quite some time.  Able to tolerate the heat.  He has not had to take any of the short acting diltiazem he states. - In the past, occasional episodes of palpitations sometimes when he sneezes the episodes will break.  Rarely has had to use the short acting diltiazem 30 mg.  He cut out caffeine previously.  Continues with long-acting diltiazem 240 mg daily.  Previously had discussed ablation as a possible strategy. -We will start Eliquis 5 mg twice a day.  Stop aspirin 81 mg. - After 3  weeks of continuous Eliquis without missing a dose we will set up for cardioversion to give him an opportunity at normal sinus rhythm once again. - He will assess his symptoms.  If he does not feel any different, we could consider keeping him in atrial fibrillation or if he does have symptoms or feels worse we could always proceed with ablative efforts. -Hemoglobin was 15.8 and creatinine 1.2 potassium 4.8 TSH 2.0 from 10/08/2021.  Hemoglobin A1c 5.9.  Primary hypertension - Blood  pressure under good control recently.  Biking.  Exercise.  Had lost approximately 50 pounds in total.   Shared Decision Making/Informed Consent The risks (stroke, cardiac arrhythmias rarely resulting in the need for a temporary or permanent pacemaker, skin irritation or burns and complications associated with conscious sedation including aspiration, arrhythmia, respiratory failure and death), benefits (restoration of normal sinus rhythm) and alternatives of a direct current cardioversion were explained in detail to Mr. Kielty and he agrees to proceed.      Medication Adjustments/Labs and Tests Ordered: Current medicines are reviewed at length with the patient today.  Concerns regarding medicines are outlined above.  Orders Placed This Encounter  Procedures   EKG 12-Lead   Meds ordered this encounter  Medications   apixaban (ELIQUIS) 5 MG TABS tablet    Sig: Take 1 tablet (5 mg total) by mouth 2 (two) times daily.    Dispense:  60 tablet    Refill:  6    Patient Instructions  Medication Instructions:  Please discontinue your Aspirin. Start Eliquis 5 mg one tablet twice a day. Continue all other medications as listed.  *If you need a refill on your cardiac medications before your next appointment, please call your pharmacy*  Testing/Procedures: Your physician has requested that you have a Cardioversion. Electrical Cardioversion uses a jolt of electricity to your heart either through paddles or wired patches  attached to your chest. This is a controlled, usually prescheduled, procedure. This procedure is done at the hospital and you are not awake during the procedure. You usually go home the day of the procedure. Please see the instruction sheet given to you today for more information.  Follow-Up: At [facility], you and your health needs are our priority.  As part of our continuing mission to provide you with exceptional heart care, we have created designated Provider Care Teams.  These Care Teams include your primary Cardiologist (physician) and Advanced Practice Providers (APPs -  Physician Assistants and Nurse Practitioners) who all work together to provide you with the care you need, when you need it.  We recommend signing up for the patient portal called "MyChart".  Sign up information is provided on this After Visit Summary.  MyChart is used to connect with patients for Virtual Visits (Telemedicine).  Patients are able to view lab/test results, encounter notes, upcoming appointments, etc.  Non-urgent messages can be sent to your provider as well.   To learn more about what you can do with MyChart, go to [email or web address].    Your next appointment:   6 week(s)   The format for your next appointment:   In Person  Provider:   You will see one of the following Advanced Practice Providers on your designated Care Team:   Randall An, PA-C  Jacolyn Reedy, [place]    Important Information About Sugar         Signed, Donato Schultz, MD  11/26/2021 11:54 AM    [facility]

## 2021-11-26 NOTE — H&P (View-Only) (Signed)
Cardiology Office Note:    Date:  11/26/2021   ID:  Nicholas Oneill, DOB 04/30/1955, MRN [id number]  PCP:  Skillman, Katherine E, PA-C   [facility] Providers Cardiologist:  Suresh Koneswaran, MD (Inactive)     Referring MD: Skillman, Katherine E, *    History of Present Illness:    Nicholas Oneill is a 65 y.o. male here for follow-up paroxysmal atrial fibrillation hypertension hyperlipidemia.  Interestingly, he is in atrial fibrillation today.  Unaware.  Previously he would state that he would sometimes sneeze and it would break his arrhythmia and he would feel better.  Has been on long-acting diltiazem for his paroxysmal atrial fibrillation.  According to prior visit in 2022 had had 2 prior episodes of likely atrial fibrillation since 2020.  Felt fatigued during these episodes.  Had been on aspirin 81 mg because of his CHA2DS2-VASc score at the time of 1.  Once he turns 64 he would need anticoagulation.  The discussion of atrial fibrillation ablation was also had with patient.  Prior EKG showed normal sinus rhythm with rate of 66 with nonspecific ST-T wave changes.  He had some erectile dysfunction issues in the past as well.  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  Past Medical History:  Diagnosis Date   Atrial fibrillation (HCC)    Dyslipidemia    Gout    Hypertension     Past Surgical History:  Procedure Laterality Date   KNEE SURGERY     As a child   VITRECTOMY     Left and right eye    Current Medications: Current Meds  Medication Sig   apixaban (ELIQUIS) 5 MG TABS tablet Take 1 tablet (5 mg total) by mouth 2 (two) times daily.   bimatoprost (LUMIGAN) 0.01 % SOLN Place 1 drop into the right eye at bedtime.   brimonidine-timolol (COMBIGAN) 0.2-0.5 % ophthalmic solution Place 1 drop into the right eye every 12 (twelve) hours.   colchicine-probenecid 0.5-500 MG tablet Take 1 tablet by mouth 2  (two) times daily.   diltiazem (CARDIZEM CD) 240 MG 24 hr capsule Take 1 capsule by mouth daily.   diltiazem (CARDIZEM) 30 MG tablet Take 1 tablet (30 mg total) by mouth as needed (palpitaitons).   ibuprofen (ADVIL,MOTRIN) 800 MG tablet Take 800 mg by mouth every 8 (eight) hours as needed.   Multiple Vitamin (MULTIVITAMIN) tablet Take 1 tablet by mouth daily.   [DISCONTINUED] aspirin EC 81 MG tablet Take 81 mg by mouth daily.     Allergies:   Other   Social History   Socioeconomic History   Marital status: Married    Spouse name: Not on file   Number of children: Not on file   Years of education: Not on file   Highest education level: Not on file  Occupational History   Not on file  Tobacco Use   Smoking status: Never   Smokeless tobacco: Never  Vaping Use   Vaping Use: Never used  Substance and Sexual Activity   Alcohol use: Yes    Comment: Occasionally   Drug use: Yes    Comment: Occasionally   Sexual activity: Not on file  Other Topics Concern   Not on file  Social History Narrative   Not on file   Social Determinants of Health   Financial Resource Strain: Not on file  Food Insecurity: Not on file  Transportation Needs:   Not on file  Physical Activity: Not on file  Stress: Not on file  Social Connections: Not on file     Family History: The patient's family history includes Heart attack in his father; Heart disease in his brother.  ROS:   Please see the history of present illness.     All other systems reviewed and are negative.  EKGs/Labs/Other Studies Reviewed:    The following studies were reviewed today:   Echocardiogram 05/17/2017 [facility] 1.  Mild concentric LVH 2.  Estimated EF 60 to 65% 3.  Left atrium mildly dilated 4.  RV global systolic function normal  EKG:  EKG is  ordered today.  The ekg ordered today demonstrates atrial fibrillation 98 bpm  Recent Labs: No results found for requested labs within last 365 days.  Recent Lipid Panel No  results found for: "CHOL", "TRIG", "HDL", "CHOLHDL", "VLDL", "LDLCALC", "LDLDIRECT"   Risk Assessment/Calculations:    CHA2DS2-VASc Score = 2   This indicates a 2.2% annual risk of stroke. The patient's score is based upon: CHF History: 0 HTN History: 1 Diabetes History: 0 Stroke History: 0 Vascular Disease History: 0 Age Score: 1 Gender Score: 0              Physical Exam:    VS:  BP 132/84   Pulse 98   Ht 6' (1.829 m)   Wt 251 lb 9.6 oz (114.1 kg)   SpO2 97%   BMI 34.12 kg/m     Wt Readings from Last 3 Encounters:  11/26/21 251 lb 9.6 oz (114.1 kg)  09/03/20 257 lb 6.4 oz (116.8 kg)  08/29/19 221 lb 3.2 oz (100.3 kg)     GEN:  Well nourished, well developed in no acute distress HEENT: Normal NECK: No JVD; No carotid bruits LYMPHATICS: No lymphadenopathy CARDIAC: Irregularly irregular , no murmurs, no rubs, gallops RESPIRATORY:  Clear to auscultation without rales, wheezing or rhonchi  ABDOMEN: Soft, non-tender, non-distended MUSCULOSKELETAL:  No edema; No deformity  SKIN: Warm and dry NEUROLOGIC:  Alert and oriented x 3 PSYCHIATRIC:  Normal affect   ASSESSMENT:    1. Paroxysmal atrial fibrillation (HCC)    PLAN:    In order of problems listed above:  Paroxysmal atrial fibrillation -EKG today shows atrial fibrillation heart rate 98 bpm.  Interestingly, he was not aware that he was in atrial fibrillation.  He says over the last 5 months he has felt the best he has felt in quite some time.  Able to tolerate the heat.  He has not had to take any of the short acting diltiazem he states. - In the past, occasional episodes of palpitations sometimes when he sneezes the episodes will break.  Rarely has had to use the short acting diltiazem 30 mg.  He cut out caffeine previously.  Continues with long-acting diltiazem 240 mg daily.  Previously had discussed ablation as a possible strategy. -We will start Eliquis 5 mg twice a day.  Stop aspirin 81 mg. - After 3  weeks of continuous Eliquis without missing a dose we will set up for cardioversion to give him an opportunity at normal sinus rhythm once again. - He will assess his symptoms.  If he does not feel any different, we could consider keeping him in atrial fibrillation or if he does have symptoms or feels worse we could always proceed with ablative efforts. -Hemoglobin was 15.8 and creatinine 1.2 potassium 4.8 TSH 2.0 from 10/08/2021.  Hemoglobin A1c 5.9.  Primary hypertension - Blood  pressure under good control recently.  Biking.  Exercise.  Had lost approximately 50 pounds in total.   Shared Decision Making/Informed Consent The risks (stroke, cardiac arrhythmias rarely resulting in the need for a temporary or permanent pacemaker, skin irritation or burns and complications associated with conscious sedation including aspiration, arrhythmia, respiratory failure and death), benefits (restoration of normal sinus rhythm) and alternatives of a direct current cardioversion were explained in detail to Mr. Kielty and he agrees to proceed.      Medication Adjustments/Labs and Tests Ordered: Current medicines are reviewed at length with the patient today.  Concerns regarding medicines are outlined above.  Orders Placed This Encounter  Procedures   EKG 12-Lead   Meds ordered this encounter  Medications   apixaban (ELIQUIS) 5 MG TABS tablet    Sig: Take 1 tablet (5 mg total) by mouth 2 (two) times daily.    Dispense:  60 tablet    Refill:  6    Patient Instructions  Medication Instructions:  Please discontinue your Aspirin. Start Eliquis 5 mg one tablet twice a day. Continue all other medications as listed.  *If you need a refill on your cardiac medications before your next appointment, please call your pharmacy*  Testing/Procedures: Your physician has requested that you have a Cardioversion. Electrical Cardioversion uses a jolt of electricity to your heart either through paddles or wired patches  attached to your chest. This is a controlled, usually prescheduled, procedure. This procedure is done at the hospital and you are not awake during the procedure. You usually go home the day of the procedure. Please see the instruction sheet given to you today for more information.  Follow-Up: At [facility], you and your health needs are our priority.  As part of our continuing mission to provide you with exceptional heart care, we have created designated Provider Care Teams.  These Care Teams include your primary Cardiologist (physician) and Advanced Practice Providers (APPs -  Physician Assistants and Nurse Practitioners) who all work together to provide you with the care you need, when you need it.  We recommend signing up for the patient portal called "MyChart".  Sign up information is provided on this After Visit Summary.  MyChart is used to connect with patients for Virtual Visits (Telemedicine).  Patients are able to view lab/test results, encounter notes, upcoming appointments, etc.  Non-urgent messages can be sent to your provider as well.   To learn more about what you can do with MyChart, go to [email or web address].    Your next appointment:   6 week(s)   The format for your next appointment:   In Person  Provider:   You will see one of the following Advanced Practice Providers on your designated Care Team:   Randall An, PA-C  Jacolyn Reedy, [place]    Important Information About Sugar         Signed, Donato Schultz, MD  11/26/2021 11:54 AM    [facility]

## 2021-11-26 NOTE — Patient Instructions (Signed)
Medication Instructions:  Please discontinue your Aspirin. Start Eliquis 5 mg one tablet twice a day. Continue all other medications as listed.  *If you need a refill on your cardiac medications before your next appointment, please call your pharmacy*  Testing/Procedures: Your physician has requested that you have a Cardioversion. Electrical Cardioversion uses a jolt of electricity to your heart either through paddles or wired patches attached to your chest. This is a controlled, usually prescheduled, procedure. This procedure is done at the hospital and you are not awake during the procedure. You usually go home the day of the procedure. Please see the instruction sheet given to you today for more information.  Follow-Up: At [facility], you and your health needs are our priority.  As part of our continuing mission to provide you with exceptional heart care, we have created designated Provider Care Teams.  These Care Teams include your primary Cardiologist (physician) and Advanced Practice Providers (APPs -  Physician Assistants and Nurse Practitioners) who all work together to provide you with the care you need, when you need it.  We recommend signing up for the patient portal called "MyChart".  Sign up information is provided on this After Visit Summary.  MyChart is used to connect with patients for Virtual Visits (Telemedicine).  Patients are able to view lab/test results, encounter notes, upcoming appointments, etc.  Non-urgent messages can be sent to your provider as well.   To learn more about what you can do with MyChart, go to [email or web address].    Your next appointment:   6 week(s)   The format for your next appointment:   In Person  Provider:   You will see one of the following Advanced Practice Providers on your designated Care Team:   [place], PA-C  Jacolyn Reedy, [place]    Important Information About Sugar

## 2021-11-27 NOTE — Addendum Note (Signed)
Addended by: Jake Bathe on: 11/27/2021 06:23 AM   Modules accepted: Orders

## 2021-12-16 ENCOUNTER — Telehealth: Payer: Self-pay | Admitting: Cardiology

## 2021-12-16 NOTE — Telephone Encounter (Signed)
Pt called asking if he can have another EKG before his upcoming procedure. He states if his heart is still out of rhythm he'll want to keep the procedure but if its back in rhythm he doesn't see the point of the procedure. Please advise.

## 2021-12-16 NOTE — Telephone Encounter (Signed)
Patient will discuss procedure at pre-op on 12/23/21. He does not want to proceed if he is not in A-fib. I told him they will not do a cardioversion if he is in SR.

## 2021-12-22 NOTE — Patient Instructions (Signed)
DAHIR AYER  12/22/2021     @PREFPERIOPPHARMACY @   Your procedure is scheduled on  12/25/2021.   Report to 12/27/2021 at  1115  A.M.   Call this number if you have problems the morning of surgery:  [phone]   Remember:  Do not eat or drink after midnight.        DO NOT miss any doses of eliquis before your procedure.     Take these medicines the morning of surgery with A SIP OF WATER                                    Colchicine, cardiazem.     Do not wear jewelry, make-up or nail polish.  Do not wear lotions, powders, or perfumes, or deodorant.  Do not shave 48 hours prior to surgery.  Men may shave face and neck.  Do not bring valuables to the hospital.  [facility] is not responsible for any belongings or valuables.  Contacts, dentures or bridgework may not be worn into surgery.  Leave your suitcase in the car.  After surgery it may be brought to your room.  For patients admitted to the hospital, discharge time will be determined by your treatment team.  Patients discharged the day of surgery will not be allowed to drive home and must have someone with them for 24 hours.    Special instructions:   DO NOT smoke tobacco or vape for 24 hours before your procedure.  Please read over the following fact sheets that you were given. Anesthesia Post-op Instructions and Care and Recovery After Surgery      Electrical Cardioversion Electrical cardioversion is the delivery of a jolt of electricity to restore a normal rhythm to the heart. A rhythm that is too fast or is not regular keeps the heart from pumping well. In this procedure, sticky patches or metal paddles are placed on the chest to deliver electricity to the heart from a device. This procedure may be done in an emergency if: There is low or no blood pressure as a result of the heart rhythm. Normal rhythm must be restored as fast as possible to protect the brain and heart from further damage. It may  save a life. This may also be a scheduled procedure for irregular or fast heart rhythms that are not immediately life-threatening. Tell a health care provider about: Any allergies you have. All medicines you are taking, including vitamins, herbs, eye drops, creams, and over-the-counter medicines. Any problems you or family members have had with anesthetic medicines. Any blood disorders you have. Any surgeries you have had. Any medical conditions you have. Whether you are pregnant or may be pregnant. What are the risks? Generally, this is a safe procedure. However, problems may occur, including: Allergic reactions to medicines. A blood clot that breaks free and travels to other parts of your body. The possible return of an abnormal heart rhythm within hours or days after the procedure. Your heart stopping (cardiac arrest). This is rare. What happens before the procedure? Medicines Your health care provider may have you start taking: Blood-thinning medicines (anticoagulants) so your blood does not clot as easily. Medicines to help stabilize your heart rate and rhythm. Ask your health care provider about: Changing or stopping your regular medicines. This is especially important if you are taking diabetes medicines or blood thinners. Taking medicines  such as aspirin and ibuprofen. These medicines can thin your blood. Do not take these medicines unless your health care provider tells you to take them. Taking over-the-counter medicines, vitamins, herbs, and supplements. General instructions Follow instructions from your health care provider about eating or drinking restrictions. Plan to have someone take you home from the hospital or clinic. If you will be going home right after the procedure, plan to have someone with you for 24 hours. Ask your health care provider what steps will be taken to help prevent infection. These may include washing your skin with a germ-killing soap. What happens  during the procedure?  An IV will be inserted into one of your veins. Sticky patches (electrodes) or metal paddles may be placed on your chest. You will be given a medicine to help you relax (sedative). An electrical shock will be delivered. The procedure may vary among health care providers and hospitals. What can I expect after the procedure? Your blood pressure, heart rate, breathing rate, and blood oxygen level will be monitored until you leave the hospital or clinic. Your heart rhythm will be watched to make sure it does not change. You may have some redness on the skin where the shocks were given. Follow these instructions at home: Do not drive for 24 hours if you were given a sedative during your procedure. Take over-the-counter and prescription medicines only as told by your health care provider. Ask your health care provider how to check your pulse. Check it often. Rest for 48 hours after the procedure or as told by your health care provider. Avoid or limit your caffeine use as told by your health care provider. Keep all follow-up visits as told by your health care provider. This is important. Contact a health care provider if: You feel like your heart is beating too quickly or your pulse is not regular. You have a serious muscle cramp that does not go away. Get help right away if: You have discomfort in your chest. You are dizzy or you feel faint. You have trouble breathing or you are short of breath. Your speech is slurred. You have trouble moving an arm or leg on one side of your body. Your fingers or toes turn cold or blue. Summary Electrical cardioversion is the delivery of a jolt of electricity to restore a normal rhythm to the heart. This procedure may be done right away in an emergency or may be a scheduled procedure if the condition is not an emergency. Generally, this is a safe procedure. After the procedure, check your pulse often as told by your health care  provider. This information is not intended to replace advice given to you by your health care provider. Make sure you discuss any questions you have with your health care provider. Document Revised: 03/13/2021 Document Reviewed: 11/14/2018 Elsevier Patient Education  2023 Elsevier Inc. Monitored Anesthesia Care, Care After This sheet gives you information about how to care for yourself after your procedure. Your health care provider may also give you more specific instructions. If you have problems or questions, contact your health care provider. What can I expect after the procedure? After the procedure, it is common to have: Tiredness. Forgetfulness about what happened after the procedure. Impaired judgment for important decisions. Nausea or vomiting. Some difficulty with balance. Follow these instructions at home: For the time period you were told by your health care provider:     Rest as needed. Do not participate in activities where you could fall  or become injured. Do not drive or use machinery. Do not drink alcohol. Do not take sleeping pills or medicines that cause drowsiness. Do not make important decisions or sign legal documents. Do not take care of children on your own. Eating and drinking Follow the diet that is recommended by your health care provider. Drink enough fluid to keep your urine pale yellow. If you vomit: Drink water, juice, or soup when you can drink without vomiting. Make sure you have little or no nausea before eating solid foods. General instructions Have a responsible adult stay with you for the time you are told. It is important to have someone help care for you until you are awake and alert. Take over-the-counter and prescription medicines only as told by your health care provider. If you have sleep apnea, surgery and certain medicines can increase your risk for breathing problems. Follow instructions from your health care provider about wearing your  sleep device: Anytime you are sleeping, including during daytime naps. While taking prescription pain medicines, sleeping medicines, or medicines that make you drowsy. Avoid smoking. Keep all follow-up visits as told by your health care provider. This is important. Contact a health care provider if: You keep feeling nauseous or you keep vomiting. You feel light-headed. You are still sleepy or having trouble with balance after 24 hours. You develop a rash. You have a fever. You have redness or swelling around the IV site. Get help right away if: You have trouble breathing. You have new-onset confusion at home. Summary For several hours after your procedure, you may feel tired. You may also be forgetful and have poor judgment. Have a responsible adult stay with you for the time you are told. It is important to have someone help care for you until you are awake and alert. Rest as told. Do not drive or operate machinery. Do not drink alcohol or take sleeping pills. Get help right away if you have trouble breathing, or if you suddenly become confused. This information is not intended to replace advice given to you by your health care provider. Make sure you discuss any questions you have with your health care provider. Document Revised: 03/18/2021 Document Reviewed: 03/16/2019 Elsevier Patient Education  2023 [employer].

## 2021-12-23 ENCOUNTER — Encounter (HOSPITAL_COMMUNITY): Payer: Self-pay

## 2021-12-23 ENCOUNTER — Encounter (HOSPITAL_COMMUNITY)
Admission: RE | Admit: 2021-12-23 | Discharge: 2021-12-23 | Disposition: A | Discharge status: Home / Self Care | Payer: Medicare Other | Source: Ambulatory Visit | Attending: Cardiology | Admitting: Cardiology

## 2021-12-23 DIAGNOSIS — I480 Paroxysmal atrial fibrillation: Secondary | ICD-10-CM | POA: Insufficient documentation

## 2021-12-23 DIAGNOSIS — Z01818 Encounter for other preprocedural examination: Secondary | ICD-10-CM | POA: Diagnosis present

## 2021-12-23 LAB — PROTIME-INR
INR: 1.3 — ABNORMAL HIGH (ref 0.8–1.2)
Prothrombin Time: 16.2 seconds — ABNORMAL HIGH (ref 11.4–15.2)

## 2021-12-24 ENCOUNTER — Telehealth: Payer: Self-pay | Admitting: Cardiology

## 2021-12-24 NOTE — Telephone Encounter (Signed)
Pt c/o medication issue:  1. Name of Medication: Eliquis  2. How are you currently taking this medication (dosage and times per day)?  Take 2 times a day  3. Are you having a reaction (difficulty breathing--STAT)?   4. What is your medication issue? Scheduled for a procedure tomorrow- wants to know if he needs to take his Eliquis  tomorrow morning before his procedure?

## 2021-12-24 NOTE — Telephone Encounter (Signed)
Called and spoke with patient.  Told pt he does need to take his Eliquis in the morning before his DCCV.  He verbalized understanding.

## 2021-12-25 ENCOUNTER — Encounter (HOSPITAL_COMMUNITY)
Admission: RE | Disposition: A | Discharge status: Home / Self Care | Payer: Self-pay | Source: Home / Self Care | Attending: Cardiology

## 2021-12-25 ENCOUNTER — Ambulatory Visit (HOSPITAL_COMMUNITY)
Admission: RE | Admit: 2021-12-25 | Discharge: 2021-12-25 | Disposition: A | Discharge status: Home / Self Care | Payer: Medicare Other | Attending: Cardiology | Admitting: Cardiology

## 2021-12-25 ENCOUNTER — Encounter (HOSPITAL_COMMUNITY): Payer: Self-pay | Admitting: Cardiology

## 2021-12-25 ENCOUNTER — Ambulatory Visit (HOSPITAL_COMMUNITY): Payer: Medicare Other | Admitting: Certified Registered Nurse Anesthetist

## 2021-12-25 ENCOUNTER — Ambulatory Visit (HOSPITAL_BASED_OUTPATIENT_CLINIC_OR_DEPARTMENT_OTHER): Payer: Medicare Other | Admitting: Certified Registered Nurse Anesthetist

## 2021-12-25 DIAGNOSIS — I4819 Other persistent atrial fibrillation: Secondary | ICD-10-CM

## 2021-12-25 DIAGNOSIS — E785 Hyperlipidemia, unspecified: Secondary | ICD-10-CM | POA: Insufficient documentation

## 2021-12-25 DIAGNOSIS — I4892 Unspecified atrial flutter: Secondary | ICD-10-CM | POA: Insufficient documentation

## 2021-12-25 DIAGNOSIS — I4891 Unspecified atrial fibrillation: Secondary | ICD-10-CM

## 2021-12-25 DIAGNOSIS — I10 Essential (primary) hypertension: Secondary | ICD-10-CM | POA: Diagnosis not present

## 2021-12-25 DIAGNOSIS — I480 Paroxysmal atrial fibrillation: Secondary | ICD-10-CM

## 2021-12-25 HISTORY — PX: CARDIOVERSION: SHX1299

## 2021-12-25 SURGERY — CARDIOVERSION
Anesthesia: General

## 2021-12-25 MED ORDER — LIDOCAINE HCL (CARDIAC) PF 100 MG/5ML IV SOSY
PREFILLED_SYRINGE | INTRAVENOUS | Status: DC | PRN
  Administered 2021-12-25: 50 mg via INTRAVENOUS

## 2021-12-25 MED ORDER — ORAL CARE MOUTH RINSE
15.0000 mL | Freq: Once | OROMUCOSAL | Status: AC
Start: 2021-12-25 — End: 2021-12-25

## 2021-12-25 MED ORDER — LACTATED RINGERS IV SOLN
INTRAVENOUS | Status: DC
  Administered 2021-12-25: 1000 mL via INTRAVENOUS

## 2021-12-25 MED ORDER — CHLORHEXIDINE GLUCONATE 0.12 % MT SOLN
15.0000 mL | Freq: Once | OROMUCOSAL | Status: AC
  Administered 2021-12-25: 15 mL via OROMUCOSAL

## 2021-12-25 MED ORDER — SODIUM CHLORIDE 0.9 % IV SOLN: INTRAVENOUS | Status: DC

## 2021-12-25 MED ORDER — PROPOFOL 10 MG/ML IV BOLUS
INTRAVENOUS | Status: DC | PRN
  Administered 2021-12-25: 80 mg via INTRAVENOUS

## 2021-12-25 SURGICAL SUPPLY — 1 items: GLOVE BIOGEL PI IND STRL 7.0 (GLOVE) ×4 IMPLANT

## 2021-12-25 NOTE — Transfer of Care (Addendum)
Immediate Anesthesia Transfer of Care Note  Patient: Nicholas Oneill  Procedure(s) Performed: CARDIOVERSION  Patient Location: PACU  Anesthesia Type:General  Level of Consciousness: awake, alert  and oriented  Airway & Oxygen Therapy: Patient Spontanous Breathing and Patient connected to nasal cannula oxygen  Post-op Assessment: Report given to RN and Post -op Vital signs reviewed and stable  Post vital signs: Reviewed and stable  Last Vitals:  Vitals Value Taken Time  BP 128/77 1322 12/25/21  Temp 98.2 1322 12/25/21  Pulse 77 1322 12/25/21  Resp 18 1322 12/25/21  SpO2 99 1322 12/25/21    Last Pain:  Vitals:   12/25/21 1144  TempSrc: Oral  PainSc: 0-No pain      Patients Stated Pain Goal: 8 (12/25/21 1144)  Complications: No notable events documented.

## 2021-12-25 NOTE — Interval H&P Note (Signed)
History and Physical Interval Note:  12/25/2021 11:52 AM  Nicholas Oneill  has presented today for surgery, with the diagnosis of a-fib.  The various methods of treatment have been discussed with the patient and family. After consideration of risks, benefits and other options for treatment, the patient has consented to  Procedure(s): CARDIOVERSION (N/A) as a surgical intervention.  The patient's history has been reviewed, patient examined, no change in status, stable for surgery.  I have reviewed the patient's chart and labs.  Questions were answered to the patient's satisfaction.     Nona Dell

## 2021-12-25 NOTE — CV Procedure (Signed)
Elective direct-current cardioversion  Indication: Persistent atrial fibrillation  Description of procedure: After informed consent was obtained, the patient was taken to the PACU where a timeout was performed.  Pads were placed anteriorly and posteriorly in standard fashion and connected to a biphasic defibrillator.  Deep sedation was achieved per the anesthesia service with use of propofol, please refer to their records for dose administration and monitoring.  Sandbag placed on the anterior chest pad.  A single, synchronized 150 J shock was delivered with successful restoration of sinus rhythm.  Sinus rhythm with PACs confirmed by ECG.  Patient remained hemodynamically stable and there were no immediate complications.  Disposition: After appropriate post sedation monitoring, patient will be discharged home in the care of his wife.  Keep follow-up visit already scheduled in September for review of rhythm, symptoms, and medications.  Jonelle Sidle, M.D., F.A.C.C.

## 2021-12-25 NOTE — Anesthesia Preprocedure Evaluation (Signed)
Anesthesia Evaluation  Patient identified by MRN, date of birth, ID band Patient awake    Reviewed: Allergy & Precautions, H&P , NPO status , Patient's Chart, lab work & pertinent test results, reviewed documented beta blocker date and time   Airway Mallampati: II  TM Distance: >3 FB Neck ROM: full    Dental no notable dental hx.    Pulmonary neg pulmonary ROS,    Pulmonary exam normal breath sounds clear to auscultation       Cardiovascular Exercise Tolerance: Good hypertension, + dysrhythmias Atrial Fibrillation  Rhythm:irregular Rate:Normal     Neuro/Psych negative neurological ROS  negative psych ROS   GI/Hepatic negative GI ROS, Neg liver ROS,   Endo/Other  negative endocrine ROS  Renal/GU negative Renal ROS  negative genitourinary   Musculoskeletal   Abdominal   Peds  Hematology negative hematology ROS (+)   Anesthesia Other Findings   Reproductive/Obstetrics negative OB ROS                             Anesthesia Physical Anesthesia Plan  ASA: 3  Anesthesia Plan: General   Post-op Pain Management:    Induction:   PONV Risk Score and Plan: Propofol infusion  Airway Management Planned:   Additional Equipment:   Intra-op Plan:   Post-operative Plan:   Informed Consent: I have reviewed the patients History and Physical, chart, labs and discussed the procedure including the risks, benefits and alternatives for the proposed anesthesia with the patient or authorized representative who has indicated his/her understanding and acceptance.     Dental Advisory Given  Plan Discussed with: CRNA  Anesthesia Plan Comments:         Anesthesia Quick Evaluation

## 2021-12-25 NOTE — CV Procedure (Signed)
Electrical Cardioversion Procedure Note Nicholas Oneill [id number] [birth year]  Procedure: Electrical Cardioversion Indications:  Atrial Fibrillation  Procedure Details Consent: confirmed and signed Time Out: Verified patient identification, verified procedure, site/side was marked, verified correct patient position, special equipment/implants available, medications/allergies/relevent history reviewed, required imaging and test results available.  1311  Patient placed on cardiac monitor, pulse oximetry, supplemental oxygen as necessary.  Sedation given:  propofol per CRNA see MAR Pacer pads placed anterior and posterior chest.  Cardioverted 1 time(s).  Cardioverted at 150J.  Evaluation Findings: Post procedure EKG shows: NSR Complications: None Patient did tolerate procedure well.   Nicholas Oneill 12/25/2021, 1:40 PM

## 2021-12-26 NOTE — Anesthesia Postprocedure Evaluation (Signed)
Anesthesia Post Note  Patient: Nicholas Oneill  Procedure(s) Performed: CARDIOVERSION  Patient location during evaluation: Phase II Anesthesia Type: General Level of consciousness: awake Pain management: pain level controlled Vital Signs Assessment: post-procedure vital signs reviewed and stable Respiratory status: spontaneous breathing and respiratory function stable Cardiovascular status: blood pressure returned to baseline and stable Postop Assessment: no headache and no apparent nausea or vomiting Anesthetic complications: no Comments: Late entry   No notable events documented.   Last Vitals:  Vitals:   12/25/21 1415 12/25/21 1438  BP: (!) 146/86 (!) 142/84  Pulse: 70 76  Resp: 15 18  Temp:  37.1 C  SpO2: 99% 99%    Last Pain:  Vitals:   12/25/21 1438  TempSrc: Oral  PainSc: 0-No pain                 Windell Norfolk

## 2022-01-01 ENCOUNTER — Encounter (HOSPITAL_COMMUNITY): Payer: Self-pay | Admitting: Cardiology

## 2022-01-13 ENCOUNTER — Ambulatory Visit: Payer: Medicare Other | Attending: Student | Admitting: Student

## 2022-01-13 ENCOUNTER — Encounter: Payer: Self-pay | Admitting: Student

## 2022-01-13 VITALS — BP 158/82 | HR 96 | Ht 72.0 in | Wt 254.8 lb

## 2022-01-13 DIAGNOSIS — Z7901 Long term (current) use of anticoagulants: Secondary | ICD-10-CM | POA: Diagnosis not present

## 2022-01-13 DIAGNOSIS — I4819 Other persistent atrial fibrillation: Secondary | ICD-10-CM

## 2022-01-13 DIAGNOSIS — I10 Essential (primary) hypertension: Secondary | ICD-10-CM

## 2022-01-13 MED ORDER — DILTIAZEM HCL ER COATED BEADS 300 MG PO CP24
300.0000 mg | ORAL_CAPSULE | Freq: Every day | ORAL | 3 refills | Status: DC
Start: 2022-01-13 — End: 2022-01-13

## 2022-01-13 MED ORDER — DILTIAZEM HCL ER COATED BEADS 300 MG PO CP24
300.0000 mg | ORAL_CAPSULE | Freq: Every day | ORAL | 0 refills | Status: DC
Start: 2022-01-13 — End: 2022-07-28

## 2022-01-13 NOTE — Patient Instructions (Signed)
Medication Instructions:  INCREASE Cardizem CD to 300 mg daily   Labwork: None today  Testing/Procedures: Your physician has requested that you have an echocardiogram. Echocardiography is a painless test that uses sound waves to create images of your heart. It provides your doctor with information about the size and shape of your heart and how well your heart's chambers and valves are working. This procedure takes approximately one hour. There are no restrictions for this procedure.   Follow-Up: 6 months  Any Other Special Instructions Will Be Listed Below (If Applicable).   Keep daily BP/HR log for several weeks, drop off after your echo  If you need a refill on your cardiac medications before your next appointment, please call your pharmacy.

## 2022-01-13 NOTE — Progress Notes (Signed)
Cardiology Office Note    Date:  01/13/2022   ID:  Nicholas Oneill, DOB [birth year], MRN [id number]  PCP:  Rosalee Kaufman, PA-C  Cardiologist: Candee Furbish, MD    Chief Complaint  Patient presents with   Follow-up    Recent DCCV    History of Present Illness:    Nicholas Oneill is a 66 y.o. male with past medical history of paroxysmal atrial fibrillation, HTN and HLD who presents to the office today for follow-up from his recent cardioversion.  He was last examined by Dr. Marlou Porch in 11/2021 and was in atrial fibrillation at the time of his visit but overall unaware. He did report some fatigue but denied any chest pain or palpitations. He was started on Eliquis 5 mg twice daily for anticoagulation with recommendations for a cardioversion following 3 weeks of anticoagulation. It was recommended to reassess symptoms afterwards and if he did not feel any different, could consider a rate-control strategy but if he did feel better in normal sinus rhythm, could consider an ablation if recurrence in the future. He did undergo DCCV by Dr. Domenic Polite on 12/25/2021 and converted back to normal sinus rhythm with a single synchronized 150 J shock.  In talking with the patient today, he reports he had "been feeling great" for several months leading up to his DCCV but had episodic dizziness and fatigue after this. He noticed symptoms a few days after his DCCV but says over the past week he has been "feeling great again". He denies any specific chest pain or dyspnea on exertion. No recent palpitations. No specific orthopnea, PND or pitting edema. He was previously utilizing an exercise bike without any symptoms and is planning to resume routine exercise.   Past Medical History:  Diagnosis Date   Atrial fibrillation ([place])    Dyslipidemia    Gout    Hypertension     Past Surgical History:  Procedure Laterality Date   CARDIOVERSION N/A 12/25/2021   Procedure: CARDIOVERSION;  Surgeon: Satira Sark, MD;  Location: [facility];  Service: Cardiovascular;  Laterality: N/A;   KNEE SURGERY     As a child   VITRECTOMY     Left and right eye    Current Medications: Outpatient Medications Prior to Visit  Medication Sig Dispense Refill   apixaban (ELIQUIS) 5 MG TABS tablet Take 1 tablet (5 mg total) by mouth 2 (two) times daily. 60 tablet 6   bimatoprost (LUMIGAN) 0.01 % SOLN Place 1 drop into the right eye at bedtime.     brimonidine-timolol (COMBIGAN) 0.2-0.5 % ophthalmic solution Place 1 drop into the right eye every 12 (twelve) hours.     colchicine-probenecid 0.5-500 MG tablet Take 1 tablet by mouth daily.     diltiazem (CARDIZEM) 30 MG tablet Take 1 tablet (30 mg total) by mouth as needed (palpitaitons). 30 tablet 2   fluticasone (FLONASE) 50 MCG/ACT nasal spray Place 1 spray into both nostrils at bedtime as needed for allergies or rhinitis.     Multiple Vitamin (MULTIVITAMIN) tablet Take 1 tablet by mouth daily.     diltiazem (CARDIZEM CD) 240 MG 24 hr capsule Take 240 mg by mouth daily.     ibuprofen (ADVIL,MOTRIN) 800 MG tablet Take 800 mg by mouth every 8 (eight) hours as needed for moderate pain. (Patient not taking: Reported on 01/13/2022)     No facility-administered medications prior to visit.     Allergies:   Other   Social History  Socioeconomic History   Marital status: Married    Spouse name: Not on file   Number of children: Not on file   Years of education: Not on file   Highest education level: Not on file  Occupational History   Not on file  Tobacco Use   Smoking status: Never   Smokeless tobacco: Never  Vaping Use   Vaping Use: Never used  Substance and Sexual Activity   Alcohol use: Yes    Comment: Occasionally   Drug use: Yes    Comment: Occasionally   Sexual activity: Not on file  Other Topics Concern   Not on file  Social History Narrative   Not on file   Social Determinants of Health   Financial Resource Strain: Not on file  Food Insecurity:  Not on file  Transportation Needs: Not on file  Physical Activity: Not on file  Stress: Not on file  Social Connections: Not on file     Family History:  The patient's family history includes Heart attack in his father; Heart disease in his brother.   Review of Systems:    Please see the history of present illness.     All other systems reviewed and are otherwise negative except as noted above.   Physical Exam:    VS:  BP (!) 158/82   Pulse 96   Ht 6' (1.829 m)   Wt 254 lb 12.8 oz (115.6 kg)   SpO2 97%   BMI 34.56 kg/m    General: Pleasant male appearing in no acute distress. Head: Normocephalic, atraumatic. Neck: No carotid bruits. JVD not elevated.  Lungs: Respirations regular and unlabored, without wheezes or rales.  Heart: Irregular irregular. No S3 or S4.  No murmur, no rubs, or gallops appreciated. Abdomen: Appears non-distended. No obvious abdominal masses. Msk:  Strength and tone appear normal for age. No obvious joint deformities or effusions. Extremities: No clubbing or cyanosis. No pitting edema.  Distal pedal pulses are 2+ bilaterally. Neuro: Alert and oriented X 3. Moves all extremities spontaneously. No focal deficits noted. Psych:  Responds to questions appropriately with a normal affect. Skin: No rashes or lesions noted  Wt Readings from Last 3 Encounters:  01/13/22 254 lb 12.8 oz (115.6 kg)  12/23/21 251 lb 8.7 oz (114.1 kg)  11/26/21 251 lb 9.6 oz (114.1 kg)     Studies/Labs Reviewed:   EKG:  EKG is ordered today. The ekg ordered today demonstrates atrial fibrillation, heart rate 110 with occasional PVCs.  Recent Labs: No results found for requested labs within last 365 days.   Lipid Panel No results found for: "CHOL", "TRIG", "HDL", "CHOLHDL", "VLDL", "LDLCALC", "LDLDIRECT"  Additional studies/ records that were reviewed today include:   Echocardiogram: 04/2017    Assessment:    1. Persistent atrial fibrillation (HCC)   2. Current use of  long term anticoagulation   3. Essential hypertension      Plan:   In order of problems listed above:  1. Persistent Atrial Fibrillation - He underwent DCCV on 12/25/2021 and converted back to normal sinus rhythm at that time but has since reverted back to atrial fibrillation. He overall feels better when in atrial fibrillation, therefore we will plan for a rate-control strategy as discussed at the time of his last office visit. Given the timeframe since his last echocardiogram and PVC's on EKG today, will arrange for a repeat echocardiogram for reassessment of his EF and wall motion. Will also request recent labs form his PCP. If  his EF has declined, he would require adjustment in his current medical therapy as he is now on Cardizem CD. For now, will titrate Cardizem CD from 240 mg daily to 300 mg daily for better rate control as his HR was in the 90's to low-100's today. - His CHA2DS2-VASc Score is at least 2. Remains on Eliquis 5mg  BID for anticoagulation and denies any evidence of active bleeding.  2. HTN - His BP was initially recorded at 182/102, improved to 158/82 on recheck. He reports feeling anxious at office visits but says this has been well-controlled when checked in other settings. Will plan to titrate Cardizem CD from 240 mg daily to 300 mg daily as discussed above. I have asked him to keep a BP/HR log and return this in the next few weeks as he may require additional medication changes.   Medication Adjustments/Labs and Tests Ordered: Current medicines are reviewed at length with the patient today.  Concerns regarding medicines are outlined above.  Medication changes, Labs and Tests ordered today are listed in the Patient Instructions below. Patient Instructions  Medication Instructions:  INCREASE Cardizem CD to 300 mg daily   Labwork: None today  Testing/Procedures: Your physician has requested that you have an echocardiogram. Echocardiography is a painless test that uses  sound waves to create images of your heart. It provides your doctor with information about the size and shape of your heart and how well your heart's chambers and valves are working. This procedure takes approximately one hour. There are no restrictions for this procedure.   Follow-Up: 6 months  Any Other Special Instructions Will Be Listed Below (If Applicable).   Keep daily BP/HR log for several weeks, drop off after your echo  If you need a refill on your cardiac medications before your next appointment, please call your pharmacy.    Signed, Erma Heritage, PA-C  01/13/2022 5:23 PM    [facility] [address] [place], [place] [address] Phone: [phone] Fax: [phone]

## 2022-01-20 ENCOUNTER — Ambulatory Visit (HOSPITAL_COMMUNITY)
Admission: RE | Admit: 2022-01-20 | Discharge: 2022-01-20 | Disposition: A | Discharge status: Home / Self Care | Payer: Medicare Other | Source: Ambulatory Visit | Attending: Student | Admitting: Student

## 2022-01-20 DIAGNOSIS — I4819 Other persistent atrial fibrillation: Secondary | ICD-10-CM | POA: Diagnosis not present

## 2022-01-20 LAB — ECHOCARDIOGRAM COMPLETE
AR max vel: 1.89 cm2
AV Area VTI: 1.8 cm2
AV Area mean vel: 1.69 cm2
AV Mean grad: 3 mmHg
AV Peak grad: 5.1 mmHg
Ao pk vel: 1.13 m/s
Area-P 1/2: 5.38 cm2
MV VTI: 1.87 cm2
S' Lateral: 3 cm

## 2022-01-20 NOTE — Progress Notes (Signed)
*  PRELIMINARY RESULTS* Echocardiogram 2D Echocardiogram has been performed.  Nicholas Oneill 01/20/2022, 4:04 PM

## 2022-02-13 ENCOUNTER — Telehealth: Payer: Self-pay | Admitting: Student

## 2022-02-13 NOTE — Telephone Encounter (Signed)
   Please let the patient know I reviewed his HR/BP log and blood pressure has overall been well controlled and heart rate is within an appropriate range as this typically runs in the 70's to 90's. Will continue current medical therapy at this time. I appreciate him keeping a detailed log of his readings.  Signed, Erma Heritage, PA-C 02/13/2022, 5:47 PM

## 2022-02-17 NOTE — Telephone Encounter (Signed)
Pt notified and voiced understanding 

## 2022-02-17 NOTE — Telephone Encounter (Signed)
Called pt. Left msg to call back with male.

## 2022-07-15 ENCOUNTER — Encounter (INDEPENDENT_AMBULATORY_CARE_PROVIDER_SITE_OTHER): Payer: Medicare Other | Admitting: Ophthalmology

## 2022-07-15 DIAGNOSIS — H35033 Hypertensive retinopathy, bilateral: Secondary | ICD-10-CM | POA: Diagnosis not present

## 2022-07-15 DIAGNOSIS — H35373 Puckering of macula, bilateral: Secondary | ICD-10-CM

## 2022-07-15 DIAGNOSIS — I10 Essential (primary) hypertension: Secondary | ICD-10-CM

## 2022-07-15 DIAGNOSIS — H338 Other retinal detachments: Secondary | ICD-10-CM | POA: Diagnosis not present

## 2022-07-15 DIAGNOSIS — H43813 Vitreous degeneration, bilateral: Secondary | ICD-10-CM

## 2022-07-28 ENCOUNTER — Encounter: Payer: Self-pay | Admitting: *Deleted

## 2022-07-28 ENCOUNTER — Encounter: Payer: Self-pay | Admitting: Physician Assistant

## 2022-07-28 ENCOUNTER — Ambulatory Visit: Payer: Medicare Other | Attending: Student | Admitting: Student

## 2022-07-28 ENCOUNTER — Encounter: Payer: Self-pay | Admitting: Student

## 2022-07-28 VITALS — BP 128/72 | HR 94 | Ht 72.0 in | Wt 253.0 lb

## 2022-07-28 DIAGNOSIS — Z7901 Long term (current) use of anticoagulants: Secondary | ICD-10-CM

## 2022-07-28 DIAGNOSIS — I4819 Other persistent atrial fibrillation: Secondary | ICD-10-CM

## 2022-07-28 DIAGNOSIS — I10 Essential (primary) hypertension: Secondary | ICD-10-CM | POA: Diagnosis not present

## 2022-07-28 MED ORDER — DILTIAZEM HCL ER COATED BEADS 300 MG PO CP24: 300.0000 mg | ORAL_CAPSULE | Freq: Every day | ORAL | 3 refills | Status: DC

## 2022-07-28 NOTE — Progress Notes (Signed)
Cardiology Office Note    Date:  07/28/2022  ID:  Nicholas Oneill 26-Dec-1955, MRN [id number] Cardiologist: Candee Furbish, MD    History of Present Illness:    Nicholas Oneill is a 67 y.o. male with past medical history of persistent atrial fibrillation (s/p DCCV in 11/2021 with recurrence and rate-control pursued), HTN and gout who presents to the office today for 72-month follow-up.  He was examined by myself in 12/2021 following his recent DCCV and reported some episodic dizziness and fatigue following the procedure but had started to feel back to his normal self within the past week. His EKG did show recurrent atrial fibrillation and given that he overall felt better when in atrial fibrillation, a rate-control strategy was pursued. A follow-up echocardiogram was recommended and Cardizem CD was titrated from 240 mg daily to 300 mg daily for better rate control and he was continued on Eliquis for anticoagulation. His echocardiogram showed a low-normal EF of 50 to 55% with mild LVH, mildly reduced RV function and trivial MR but no significant valve abnormalities.  In talking with the patient today, he reports things have overall been stable from a cardiac perspective since his last office visit. He denies any recent palpitations or chest pain. He did have recent respiratory issues last month but says this has now resolved with OTC medications. No specific orthopnea, PND or pitting edema. He remains on Eliquis for anticoagulation with no reports of active bleeding. Says his activity has been limited secondary to gout and he has been taking Colchicine with improvement in symptoms.   Studies Reviewed:   EKG: EKG is not ordered today. EKG from 01/13/2022 is reviewed and shows atrial fibrillation with RVR, HR 110 with PVC's.   Echocardiogram: 12/2021 IMPRESSIONS     1. Left ventricular ejection fraction, by estimation, is 50 to 55%. The  left ventricle has low normal function. Left ventricular  endocardial  border not optimally defined to evaluate regional wall motion. There is  mild concentric left ventricular  hypertrophy. Left ventricular diastolic parameters are indeterminate.   2. Right ventricular systolic function is mildly reduced. The right  ventricular size is normal. There is normal pulmonary artery systolic  pressure. The estimated right ventricular systolic pressure is [id number] mmHg.   3. The mitral valve is grossly normal. Trivial mitral valve  regurgitation.   4. The aortic valve is tricuspid. There is moderate calcification of the  aortic valve. Aortic valve regurgitation is not visualized. Aortic valve  sclerosis/calcification is present, without any evidence of aortic  stenosis. Aortic valve mean gradient  measures 3.0 mmHg.   5. The inferior vena cava is normal in size with greater than 50%  respiratory variability, suggesting right atrial pressure of 3 mmHg.   Comparison(s): No prior Echocardiogram.    Physical Exam:   VS:  BP 128/72   Pulse 94   Ht 6' (1.829 m)   Wt 253 lb (114.8 kg)   SpO2 95%   BMI 34.31 kg/m    Wt Readings from Last 3 Encounters:  07/28/22 253 lb (114.8 kg)  01/13/22 254 lb 12.8 oz (115.6 kg)  12/23/21 251 lb 8.7 oz (114.1 kg)     GEN: Pleasant male appearing in no acute distress NECK: No JVD; No carotid bruits CARDIAC: Irregularly irregular, no murmurs, rubs, gallops RESPIRATORY:  Clear to auscultation without rales, wheezing or rhonchi  ABDOMEN: Appears non-distended. No obvious abdominal masses. EXTREMITIES: No clubbing or cyanosis. No pitting edema.  Distal pedal  pulses are 2+ bilaterally.   Assessment and Plan:   1. Persistent Atrial Fibrillation - He feels better when in atrial fibrillation as discussed above and rates have overall been well-controlled, mostly in the 70's to 80's when checked at home. Will continue Cardizem CD 300 mg daily for rate control. - No reports of active bleeding. He remains on Eliquis 5 mg  twice daily for anticoagulation. He did have labs with his PCP within the past 2 months and we will request a copy of his most recent CBC and BMET.   2. HTN - BP was initially recorded at 142/80, rechecked and improved to 128/72. This has also been well-controlled when checked at home as he brings with him today a very detailed BP/HR log. Continue current medical therapy with Cardizem CD 300 mg daily.   Signed, Erma Heritage, PA-C

## 2022-07-28 NOTE — Patient Instructions (Signed)
Medication Instructions:   Continue current medication regimen.   Follow-Up:  In 6 months with Bernerd Pho, PA-C or Dr. Marlou Porch  Any Other Special Instructions Will Be Listed Below (If Applicable).   If you need a refill on your cardiac medications before your next appointment, please call your pharmacy.

## 2022-09-16 ENCOUNTER — Telehealth: Payer: Self-pay | Admitting: Student

## 2022-09-16 NOTE — Telephone Encounter (Signed)
*  STAT* If patient is at the pharmacy, call can be transferred to refill team.   1. Which medications need to be refilled? (please list name of each medication and dose if known) apixaban (ELIQUIS) 5 MG TABS tablet   2. Which pharmacy/location (including street and city if local pharmacy) is medication to be sent to?  [facility] - [place],  - [address]   3. Do they need a 30 day or 90 day supply? 90

## 2022-09-16 NOTE — Telephone Encounter (Signed)
I will forward to provider for review °

## 2022-09-16 NOTE — Telephone Encounter (Signed)
Pt c/o medication issue:  1. Name of Medication: diltiazem (CARDIZEM CD) 300 MG 24 hr capsule   2. How are you currently taking this medication (dosage and times per day)? Take 1 capsule (300 mg total) by mouth daily.    3. Are you having a reaction (difficulty breathing--STAT)? No   4. What is your medication issue? Clarisa from [facility] called and said that this prescription is on back order and wanted to know if the could use the diltiazem (CARDIZEM ER) 300 MG 24 hr capsule. Her Phone # is [phone]. Name is [place]; [id number] #: [phone]

## 2022-09-17 MED ORDER — APIXABAN 5 MG PO TABS: 5.0000 mg | ORAL_TABLET | Freq: Two times a day (BID) | ORAL | 1 refills | Status: DC

## 2022-09-17 NOTE — Telephone Encounter (Signed)
Prescription refill request for Eliquis received. Indication: AF Last office visit: 07/28/22  B Strader PA-C Scr: 1.15 on 06/03/22 Age: 67 Weight: 114.8kg  Based on above findings Eliquis 5mg  twice daily is the appropriate dose.  Refill approved.

## 2022-09-17 NOTE — Telephone Encounter (Signed)
Spoke with Min with [facility] Who will change to Diltiazem 300 daily.

## 2023-01-26 NOTE — Progress Notes (Unsigned)
Cardiology Office Note    Date:  01/27/2023  ID:  Nicholas Oneill 1956/04/07, MRN [id number] Cardiologist: Donato Schultz, MD    History of Present Illness:    Nicholas Oneill is a 67 y.o. male with past medical history of persistent atrial fibrillation (s/p DCCV in 11/2021 with recurrence and rate-control pursued), HTN and gout who presents to the office today for 83-month follow-up.  He was examined by myself in 07/2022 and denied any recent chest pain, dyspnea on exertion or palpitations at that time. Activity was limited due to gout. Given that he was overall doing well, he was continued on his current medical therapy with Cardizem CD 300 mg daily and Eliquis 5 mg twice daily for anticoagulation.  In talking with the patient today, he reports things have been stable since his last office visit. He is overall asymptomatic with his atrial fibrillation and as discussed during prior office visits, reports feeling better in the arrhythmia than he did in normal sinus rhythm. He does check his vitals at home and reports his heart rate has overall been well-controlled and has gradually decreased into the 60's to 70's over the past few months. He denies any specific exertional chest pain or progressive dyspnea on exertion. No recent orthopnea, PND or pitting edema. Continues to have intermittent episodes of gout and takes Colchicine on a daily basis along with an extra tablet as needed for worsening symptoms. He continues to work at the [employer] on weekends.  Studies Reviewed:   EKG: EKG is ordered today and demonstrates:   EKG Interpretation Date/Time:  Wednesday January 27 2023 13:08:25 EDT Ventricular Rate:  75 PR Interval:    QRS Duration:  78 QT Interval:  378 QTC Calculation: 422 R Axis:   68  Text Interpretation: Atrial fibrillation - rate controlled. No acute changes Confirmed by Randall An ([id number]) on 01/27/2023 1:10:41 PM       Echocardiogram: 12/2021 IMPRESSIONS      1. Left ventricular ejection fraction, by estimation, is 50 to 55%. The  left ventricle has low normal function. Left ventricular endocardial  border not optimally defined to evaluate regional wall motion. There is  mild concentric left ventricular  hypertrophy. Left ventricular diastolic parameters are indeterminate.   2. Right ventricular systolic function is mildly reduced. The right  ventricular size is normal. There is normal pulmonary artery systolic  pressure. The estimated right ventricular systolic pressure is 15.2 mmHg.   3. The mitral valve is grossly normal. Trivial mitral valve  regurgitation.   4. The aortic valve is tricuspid. There is moderate calcification of the  aortic valve. Aortic valve regurgitation is not visualized. Aortic valve  sclerosis/calcification is present, without any evidence of aortic  stenosis. Aortic valve mean gradient  measures 3.0 mmHg.   5. The inferior vena cava is normal in size with greater than 50%  respiratory variability, suggesting right atrial pressure of 3 mmHg.   Comparison(s): No prior Echocardiogram.   Risk Assessment/Calculations:    CHA2DS2-VASc Score = 3   This indicates a 3.2% annual risk of stroke. The patient's score is based upon: CHF History: 0 HTN History: 1 Diabetes History: 0 Stroke History: 0 Vascular Disease History: 1 Age Score: 1 Gender Score: 0    Physical Exam:   VS:  BP 132/78   Pulse 75   Ht 5\' 11"  (1.803 m)   Wt 257 lb 12.8 oz (116.9 kg)   SpO2 96%   BMI 35.96 kg/m  Wt Readings from Last 3 Encounters:  01/27/23 257 lb 12.8 oz (116.9 kg)  07/28/22 253 lb (114.8 kg)  01/13/22 254 lb 12.8 oz (115.6 kg)     GEN: Well nourished, well developed male appearing in no acute distress NECK: No JVD; No carotid bruits CARDIAC: Irregularly irregular, no murmurs, rubs, gallops RESPIRATORY:  Clear to auscultation without rales, wheezing or rhonchi  ABDOMEN: Appears non-distended. No obvious abdominal  masses. EXTREMITIES: No clubbing or cyanosis. No pitting edema.  Distal pedal pulses are 2+ bilaterally.   Assessment and Plan:   1. Persistent Atrial Fibrillation/Use of Long-term Anticoagulation - He denies any recent palpitations and says his heart-rate has overall been well-controlled when checked at home. Continue Cardizem CD 300 mg daily. He does have a prescription for extra short-acting Cardizem but has not needed to utilize this in several months. - No reports of active bleeding. He remains on Eliquis 5 mg twice daily for anticoagulation. He did have follow-up labs with his PCP last week and we will request a copy of these.  2. HTN - Blood pressure is well-controlled at 132/78 during today's visit he reports this has actually been lower when checked at home. Continue Cardizem CD 300 mg daily.  Signed, Ellsworth Lennox, PA-C

## 2023-01-27 ENCOUNTER — Encounter: Payer: Self-pay | Admitting: Student

## 2023-01-27 ENCOUNTER — Encounter: Payer: Self-pay | Admitting: *Deleted

## 2023-01-27 ENCOUNTER — Ambulatory Visit: Payer: Medicare Other | Attending: Student | Admitting: Student

## 2023-01-27 VITALS — BP 132/78 | HR 75 | Ht 71.0 in | Wt 257.8 lb

## 2023-01-27 DIAGNOSIS — Z7901 Long term (current) use of anticoagulants: Secondary | ICD-10-CM | POA: Diagnosis not present

## 2023-01-27 DIAGNOSIS — I10 Essential (primary) hypertension: Secondary | ICD-10-CM | POA: Diagnosis not present

## 2023-01-27 DIAGNOSIS — I4819 Other persistent atrial fibrillation: Secondary | ICD-10-CM

## 2023-01-27 MED ORDER — APIXABAN 5 MG PO TABS: 5.0000 mg | ORAL_TABLET | Freq: Two times a day (BID) | ORAL | 3 refills | Status: DC

## 2023-01-27 NOTE — Patient Instructions (Signed)
Medication Instructions:  Your physician recommends that you continue on your current medications as directed. Please refer to the Current Medication list given to you today.  *If you need a refill on your cardiac medications before your next appointment, please call your pharmacy*   Lab Work: NONE   If you have labs (blood work) drawn today and your tests are completely normal, you will receive your results only by: MyChart Message (if you have MyChart) OR A paper copy in the mail If you have any lab test that is abnormal or we need to change your treatment, we will call you to review the results.   Testing/Procedures: NONE    Follow-Up: At [facility], you and your health needs are our priority.  As part of our continuing mission to provide you with exceptional heart care, we have created designated Provider Care Teams.  These Care Teams include your primary Cardiologist (physician) and Advanced Practice Providers (APPs -  Physician Assistants and Nurse Practitioners) who all work together to provide you with the care you need, when you need it.  We recommend signing up for the patient portal called "MyChart".  Sign up information is provided on this After Visit Summary.  MyChart is used to connect with patients for Virtual Visits (Telemedicine).  Patients are able to view lab/test results, encounter notes, upcoming appointments, etc.  Non-urgent messages can be sent to your provider as well.   To learn more about what you can do with MyChart, go to [email or web address].    Your next appointment:   6 month(s)  Provider:   Luane School, MD or Randall An, PA-C    Other Instructions Thank you for choosing [facility]!

## 2023-07-07 ENCOUNTER — Encounter (INDEPENDENT_AMBULATORY_CARE_PROVIDER_SITE_OTHER): Payer: Medicare Other | Admitting: Ophthalmology

## 2023-07-07 DIAGNOSIS — H35373 Puckering of macula, bilateral: Secondary | ICD-10-CM

## 2023-07-07 DIAGNOSIS — H43813 Vitreous degeneration, bilateral: Secondary | ICD-10-CM

## 2023-07-07 DIAGNOSIS — H338 Other retinal detachments: Secondary | ICD-10-CM | POA: Diagnosis not present

## 2023-07-07 DIAGNOSIS — I10 Essential (primary) hypertension: Secondary | ICD-10-CM | POA: Diagnosis not present

## 2023-07-07 DIAGNOSIS — H35033 Hypertensive retinopathy, bilateral: Secondary | ICD-10-CM

## 2023-07-22 ENCOUNTER — Telehealth: Payer: Self-pay | Admitting: Internal Medicine

## 2023-07-22 DIAGNOSIS — R0602 Shortness of breath: Secondary | ICD-10-CM

## 2023-07-22 DIAGNOSIS — R609 Edema, unspecified: Secondary | ICD-10-CM

## 2023-07-22 NOTE — Telephone Encounter (Signed)
 Pt c/o Shortness Of Breath: STAT if SOB developed within the last 24 hours or pt is noticeably SOB on the phone  1. Are you currently SOB (can you hear that pt is SOB on the phone)? no  2. How long have you been experiencing SOB? 1-2 weeks  3. Are you SOB when sitting or when up moving around?   4. Are you currently experiencing any other symptoms? LE Edema Pt was saw by PCP and they did x-ray and it suggested CHF

## 2023-07-23 ENCOUNTER — Encounter: Payer: Self-pay | Admitting: Internal Medicine

## 2023-07-23 NOTE — Telephone Encounter (Signed)
 Unable to Speak with anyone on the phone. Will fax letter to office to send [place] all information they have regarding this. No information in care everywhere regarding this.

## 2023-07-23 NOTE — Telephone Encounter (Signed)
 Letter faxed to [place] office to fax [place] all records for this indication for further evaluation.

## 2023-07-27 NOTE — Telephone Encounter (Signed)
 Spoke with patient and advised him [place] wanted to order and Echo before his office visit with Dr.Mallipeddi . Patient verbalized understanding and is willing to have echo done prior to his OV.  Will place order and send for scheduling

## 2023-07-27 NOTE — Telephone Encounter (Signed)
 Ellsworth Lennox, PA-C  Dion Saucier, PA-C Cc: Sharen Hones Phone Number: [phone]   Not overstepping at all! Sounds perfect! I am going to tag one of our clinical team members in this message so we can get him setup for one!  Thanks, [place] - Can we order a follow-up echo for shortness of breath and edema? Would try to get before his appointment on 5/20. Thanks!       Previous Messages    ----- Message ----- From: Antonietta Jewel Sent: 07/26/2023  11:49 AM EDT To: Ellsworth Lennox, PA-C Subject: RE: Shortness of breath                        Yes, I already started him on Lasix 20mg  daily at his appointment last week. Just didn't want to overstep and order an updated echo- that would be great if you guys set that up prior to his next follow up. Thanks for the reply!  ----- Message ----- From: Carlyon Prows Sent: 07/22/2023   7:29 PM EDT To: Dion Saucier, PA-C Subject: RE: Shortness of breath                        Good evening! Thanks for the information. From what they sent me, I was unsure if the phone call was from the patient or the office. I last saw him 5 months ago and he was doing well at that time. Given the acute weight gain, has he been started on a diuretic? If not, it might be worth just trying Lasix 20mg  as needed. We can arrange for a follow-up echo if needed since his last study was in 2023. I think he is seeing [place] back in May but we can get him in sooner if needed.  Best, [place] ----- Message ----- From: Antonietta Jewel Sent: 07/22/2023   6:17 PM EDT To: Ellsworth Lennox, PA-C Subject: Shortness of breath                            Hi [place], I saw Dequavious today and had my nurse call to give an update- I saw your message about more info. He is just generally short of breath, not necessarily with exertion. I did not check a BNP but we can if you would like. His weight is up 8 pounds from last office visit on  2/6. He denied any prior episodes of edema. If there is anything else you would like to know, feel free to call the office. Thanks!

## 2023-08-11 ENCOUNTER — Ambulatory Visit: Attending: Student

## 2023-08-11 DIAGNOSIS — R0602 Shortness of breath: Secondary | ICD-10-CM

## 2023-08-11 DIAGNOSIS — R609 Edema, unspecified: Secondary | ICD-10-CM

## 2023-08-11 MED ORDER — PERFLUTREN LIPID MICROSPHERE
1.0000 mL | INTRAVENOUS | Status: AC | PRN
  Administered 2023-08-11: 3.5 mL via INTRAVENOUS

## 2023-08-12 LAB — ECHOCARDIOGRAM COMPLETE
AR max vel: 1.77 cm2
AV Area VTI: 1.87 cm2
AV Area mean vel: 1.86 cm2
AV Mean grad: 5 mmHg
AV Peak grad: 9.8 mmHg
Ao pk vel: 1.57 m/s
Calc EF: 67.1 %
MV VTI: 1.92 cm2
S' Lateral: 2.7 cm
Single Plane A2C EF: 63.9 %
Single Plane A4C EF: 70.1 %

## 2023-09-14 ENCOUNTER — Encounter: Payer: Self-pay | Admitting: Internal Medicine

## 2023-09-14 ENCOUNTER — Ambulatory Visit: Attending: Internal Medicine | Admitting: Internal Medicine

## 2023-09-14 VITALS — BP 152/92 | HR 80 | Ht 71.0 in | Wt 252.0 lb

## 2023-09-14 DIAGNOSIS — I5032 Chronic diastolic (congestive) heart failure: Secondary | ICD-10-CM | POA: Diagnosis not present

## 2023-09-14 DIAGNOSIS — I4819 Other persistent atrial fibrillation: Secondary | ICD-10-CM

## 2023-09-14 DIAGNOSIS — D6869 Other thrombophilia: Secondary | ICD-10-CM | POA: Diagnosis not present

## 2023-09-14 NOTE — Progress Notes (Signed)
 Cardiology Office Note  Date: 09/14/2023   ID: Nicholas, Oneill 01/07/1956, MRN [id number]  PCP:  Lizabeth Riggs, PA-C  Cardiologist:  Dorothye Gathers, MD Electrophysiologist:  None   History of Present Illness: Nicholas Oneill is a 68 y.o. male known to have A-fib s/p DCCV unsuccessful in 2023, HTN is here for follow-up visit.  DOE on and off, takes torsemide 10 mg weekly.  He also has worsening fatigue.  He was never tested for OSA.  No palpitations, chest pain, dizziness or leg swelling.  Past Medical History:  Diagnosis Date   Atrial fibrillation (HCC)    Dyslipidemia    Gout    Hypertension     Past Surgical History:  Procedure Laterality Date   CARDIOVERSION N/A 12/25/2021   Procedure: CARDIOVERSION;  Surgeon: Gerard Knight, MD;  Location: [facility];  Service: Cardiovascular;  Laterality: N/A;   KNEE SURGERY     As a child   VITRECTOMY     Left and right eye    Current Outpatient Medications  Medication Sig Dispense Refill   apixaban  (ELIQUIS ) 5 MG TABS tablet Take 1 tablet (5 mg total) by mouth 2 (two) times daily. 180 tablet 3   bimatoprost (LUMIGAN) 0.01 % SOLN Place 1 drop into the right eye at bedtime.     brimonidine-timolol (COMBIGAN) 0.2-0.5 % ophthalmic solution Place 1 drop into the right eye every 12 (twelve) hours.     colchicine 0.6 MG tablet Take 0.6 mg by mouth daily.     colchicine-probenecid 0.5-500 MG tablet Take 1 tablet by mouth daily. Takes either combination or plain colchicine.     diltiazem  (CARDIZEM  CD) 300 MG 24 hr capsule Take 1 capsule (300 mg total) by mouth daily. 90 capsule 3   diltiazem  (CARDIZEM ) 30 MG tablet Take 1 tablet (30 mg total) by mouth as needed (palpitaitons). 30 tablet 2   fluticasone (FLONASE) 50 MCG/ACT nasal spray Place 1 spray into both nostrils at bedtime as needed for allergies or rhinitis.     Multiple Vitamin (MULTIVITAMIN) tablet Take 1 tablet by mouth daily.     torsemide (DEMADEX) 10 MG tablet Take 10  mg by mouth once a week.     No current facility-administered medications for this visit.   Allergies:  Other   Social History: The patient  reports that he has never smoked. He has never used smokeless tobacco. He reports current alcohol use. He reports current drug use.   Family History: The patient's family history includes Heart attack in his father; Heart disease in his brother.   ROS:  Please see the history of present illness. Otherwise, complete review of systems is positive for none  All other systems are reviewed and negative.   Physical Exam: VS:  BP (!) 152/92 (BP Location: Left Arm, Cuff Size: Normal)   Pulse 80   Ht 5\' 11"  (1.803 m)   Wt 252 lb (114.3 kg)   SpO2 96%   BMI 35.15 kg/m , BMI Body mass index is 35.15 kg/m.  Wt Readings from Last 3 Encounters:  09/14/23 252 lb (114.3 kg)  01/27/23 257 lb 12.8 oz (116.9 kg)  07/28/22 253 lb (114.8 kg)    General: Patient appears comfortable at rest. HEENT: Conjunctiva and lids normal, oropharynx clear with moist mucosa. Neck: Supple, no elevated JVP or carotid bruits, no thyromegaly. Lungs: Clear to auscultation, nonlabored breathing at rest. Cardiac: Regular rate and rhythm, no S3 or significant systolic murmur, no  pericardial rub. Abdomen: Soft, nontender, no hepatomegaly, bowel sounds present, no guarding or rebound. Extremities: No pitting edema, distal pulses 2+. Skin: Warm and dry. Musculoskeletal: No kyphosis. Neuropsychiatric: Alert and oriented x3, affect grossly appropriate.  Recent Labwork: No results found for requested labs within last 365 days.  No results found for: "CHOL", "TRIG", "HDL", "CHOLHDL", "VLDL", "LDLCALC", "LDLDIRECT"   Assessment and Plan:   Persistent A-fib s/p unsuccessful DCCV in August 2023: Asymptomatic, EKG today showed A-fib, HR 81 bpm.  Rate control pursued, continue diltiazem  300 mg once daily and Eliquis  5 mg twice daily.  ? Chronic static heart failure: Currently on  torsemide 10 mg weekly.  Has DOE on and off.  Instructed him to check weights daily and reach out to us  if weights go up.  Will reevaluate his symptoms in 6 months to see if he needs to be on SGLT2 inhibitors.  Secondary hypercoagulable state: Continue Eliquis  5 mg BD.  HTN, controlled: Continue current antihypertensive medications.    20 to 25 minutes spent in discussing the management of A-fib.  Answered all the questions.   Medication Adjustments/Labs and Tests Ordered: Current medicines are reviewed at length with the patient today.  Concerns regarding medicines are outlined above.    Disposition:  Follow up 1 year  Signed Marjorie Lussier Beauford Bounds, MD, 09/14/2023 10:33 AM    [facility] [address] [place], [place], [place] [address]

## 2023-09-14 NOTE — Patient Instructions (Addendum)

## 2023-09-22 ENCOUNTER — Other Ambulatory Visit: Payer: Self-pay | Admitting: Student

## 2023-12-02 ENCOUNTER — Other Ambulatory Visit (HOSPITAL_BASED_OUTPATIENT_CLINIC_OR_DEPARTMENT_OTHER): Payer: Self-pay

## 2023-12-02 MED ORDER — DOXYCYCLINE HYCLATE 100 MG PO CAPS
100.0000 mg | ORAL_CAPSULE | Freq: Two times a day (BID) | ORAL | 0 refills | Status: AC
Start: 2023-12-02 — End: 2023-12-09
  Filled 2023-12-02: qty 14, 7d supply, fill #0

## 2023-12-14 ENCOUNTER — Other Ambulatory Visit (HOSPITAL_BASED_OUTPATIENT_CLINIC_OR_DEPARTMENT_OTHER): Payer: Self-pay

## 2023-12-14 MED ORDER — CEPHALEXIN 500 MG PO CAPS
500.0000 mg | ORAL_CAPSULE | Freq: Three times a day (TID) | ORAL | 0 refills | Status: AC
Start: 2023-12-14 — End: 2023-12-19
  Filled 2023-12-14: qty 15, 5d supply, fill #0

## 2023-12-14 MED ORDER — DOXYCYCLINE HYCLATE 100 MG PO CAPS
100.0000 mg | ORAL_CAPSULE | Freq: Two times a day (BID) | ORAL | 0 refills | Status: AC
  Filled 2023-12-14: qty 10, 5d supply, fill #0

## 2023-12-21 ENCOUNTER — Other Ambulatory Visit (HOSPITAL_BASED_OUTPATIENT_CLINIC_OR_DEPARTMENT_OTHER): Payer: Self-pay

## 2024-01-17 ENCOUNTER — Other Ambulatory Visit (HOSPITAL_COMMUNITY): Payer: Self-pay | Admitting: Physician Assistant

## 2024-01-17 DIAGNOSIS — R609 Edema, unspecified: Secondary | ICD-10-CM

## 2024-01-20 ENCOUNTER — Ambulatory Visit (HOSPITAL_COMMUNITY)
Admission: RE | Admit: 2024-01-20 | Discharge: 2024-01-20 | Disposition: A | Discharge status: Home / Self Care | Source: Ambulatory Visit | Attending: Physician Assistant | Admitting: Physician Assistant

## 2024-01-20 DIAGNOSIS — R609 Edema, unspecified: Secondary | ICD-10-CM | POA: Insufficient documentation

## 2024-02-03 ENCOUNTER — Other Ambulatory Visit (HOSPITAL_BASED_OUTPATIENT_CLINIC_OR_DEPARTMENT_OTHER): Payer: Self-pay

## 2024-02-03 MED ORDER — TERBINAFINE HCL 250 MG PO TABS
250.0000 mg | ORAL_TABLET | Freq: Every day | ORAL | 0 refills | Status: DC
  Filled 2024-02-03: qty 10, 10d supply, fill #0

## 2024-02-03 MED ORDER — DOXYCYCLINE HYCLATE 100 MG PO TABS
100.0000 mg | ORAL_TABLET | Freq: Two times a day (BID) | ORAL | 0 refills | Status: DC
  Filled 2024-02-03: qty 14, 7d supply, fill #0

## 2024-02-15 ENCOUNTER — Other Ambulatory Visit (HOSPITAL_BASED_OUTPATIENT_CLINIC_OR_DEPARTMENT_OTHER): Payer: Self-pay

## 2024-02-15 MED ORDER — BRIMONIDINE TARTRATE-TIMOLOL 0.2-0.5 % OP SOLN
1.0000 [drp] | Freq: Two times a day (BID) | OPHTHALMIC | 12 refills | Status: AC
  Filled 2024-02-15: qty 15, 150d supply, fill #0

## 2024-02-15 MED ORDER — LUMIGAN 0.01 % OP SOLN
1.0000 [drp] | Freq: Every evening | OPHTHALMIC | 12 refills | Status: AC
  Filled 2024-02-15: qty 5, 90d supply, fill #0

## 2024-02-16 ENCOUNTER — Other Ambulatory Visit (HOSPITAL_BASED_OUTPATIENT_CLINIC_OR_DEPARTMENT_OTHER): Payer: Self-pay

## 2024-03-03 ENCOUNTER — Other Ambulatory Visit (HOSPITAL_BASED_OUTPATIENT_CLINIC_OR_DEPARTMENT_OTHER): Payer: Self-pay

## 2024-03-03 MED ORDER — DOXYCYCLINE HYCLATE 100 MG PO TABS
100.0000 mg | ORAL_TABLET | Freq: Two times a day (BID) | ORAL | 0 refills | Status: AC
  Filled 2024-03-03: qty 20, 10d supply, fill #0

## 2024-03-07 ENCOUNTER — Other Ambulatory Visit (HOSPITAL_BASED_OUTPATIENT_CLINIC_OR_DEPARTMENT_OTHER): Payer: Self-pay

## 2024-03-07 ENCOUNTER — Other Ambulatory Visit: Payer: Self-pay | Admitting: *Deleted

## 2024-03-07 MED ORDER — KETOCONAZOLE 2 % EX CREA
TOPICAL_CREAM | CUTANEOUS | 0 refills | Status: AC
  Filled 2024-03-07: qty 30, 30d supply, fill #0

## 2024-03-07 MED ORDER — APIXABAN 5 MG PO TABS
5.0000 mg | ORAL_TABLET | Freq: Two times a day (BID) | ORAL | 0 refills | Status: DC
  Filled 2024-03-07: qty 180, 90d supply, fill #0

## 2024-03-07 MED ORDER — TERBINAFINE HCL 250 MG PO TABS
250.0000 mg | ORAL_TABLET | Freq: Every day | ORAL | 0 refills | Status: AC
  Filled 2024-03-07: qty 7, 7d supply, fill #0

## 2024-03-21 ENCOUNTER — Other Ambulatory Visit (HOSPITAL_BASED_OUTPATIENT_CLINIC_OR_DEPARTMENT_OTHER): Payer: Self-pay

## 2024-03-21 MED ORDER — TERBINAFINE HCL 250 MG PO TABS
250.0000 mg | ORAL_TABLET | Freq: Every day | ORAL | 0 refills | Status: DC
  Filled 2024-03-21: qty 30, 30d supply, fill #0

## 2024-03-21 MED ORDER — DOXYCYCLINE HYCLATE 100 MG PO TABS
100.0000 mg | ORAL_TABLET | Freq: Two times a day (BID) | ORAL | 0 refills | Status: AC
  Filled 2024-03-21: qty 20, 10d supply, fill #0

## 2024-03-21 MED ORDER — TRIAMCINOLONE ACETONIDE 0.1 % EX CREA
TOPICAL_CREAM | CUTANEOUS | 0 refills | Status: AC
  Filled 2024-03-21: qty 30, 30d supply, fill #0

## 2024-03-24 ENCOUNTER — Other Ambulatory Visit (HOSPITAL_BASED_OUTPATIENT_CLINIC_OR_DEPARTMENT_OTHER): Payer: Self-pay

## 2024-03-24 MED ORDER — COLCHICINE 0.6 MG PO TABS
0.6000 mg | ORAL_TABLET | Freq: Every day | ORAL | 0 refills | Status: AC | PRN
Start: 2024-03-22 — End: ?
  Filled 2024-03-24: qty 90, 90d supply, fill #0

## 2024-04-13 ENCOUNTER — Other Ambulatory Visit: Payer: Self-pay | Admitting: Internal Medicine

## 2024-04-13 ENCOUNTER — Other Ambulatory Visit (HOSPITAL_BASED_OUTPATIENT_CLINIC_OR_DEPARTMENT_OTHER): Payer: Self-pay

## 2024-04-13 ENCOUNTER — Other Ambulatory Visit: Payer: Self-pay

## 2024-04-13 MED ORDER — APIXABAN 5 MG PO TABS
5.0000 mg | ORAL_TABLET | Freq: Two times a day (BID) | ORAL | 0 refills | Status: AC
  Filled 2024-04-13: qty 180, 90d supply, fill #0

## 2024-04-13 MED FILL — Diltiazem HCl Tab ER 24HR 300 MG: 300.0000 mg | ORAL | 90 days supply | Qty: 90 | Fill #0 | Status: AC

## 2024-04-13 NOTE — Telephone Encounter (Signed)
 Prescription refill request for Eliquis  received. Indication: afib  Last office visit: Mallipeddi, 09/14/2023  Scr: 1.22;  06/10/2023 Age: 68 yo  Weight: 114 kg   Refill sent

## 2024-04-14 ENCOUNTER — Other Ambulatory Visit (HOSPITAL_BASED_OUTPATIENT_CLINIC_OR_DEPARTMENT_OTHER): Payer: Self-pay

## 2024-04-21 ENCOUNTER — Other Ambulatory Visit (HOSPITAL_BASED_OUTPATIENT_CLINIC_OR_DEPARTMENT_OTHER): Payer: Self-pay

## 2024-04-21 MED ORDER — PREDNISONE 20 MG PO TABS
40.0000 mg | ORAL_TABLET | Freq: Every day | ORAL | 0 refills | Status: AC
  Filled 2024-04-21: qty 10, 5d supply, fill #0

## 2024-05-11 ENCOUNTER — Other Ambulatory Visit (HOSPITAL_BASED_OUTPATIENT_CLINIC_OR_DEPARTMENT_OTHER): Payer: Self-pay

## 2024-05-11 MED ORDER — TERBINAFINE HCL 250 MG PO TABS
250.0000 mg | ORAL_TABLET | Freq: Every day | ORAL | 0 refills | Status: AC
  Filled 2024-05-11: qty 30, 30d supply, fill #0

## 2024-05-11 MED ORDER — ELIQUIS 5 MG PO TABS: 5.0000 mg | ORAL_TABLET | Freq: Two times a day (BID) | ORAL | 0 refills | Status: AC

## 2024-05-11 MED ORDER — SILDENAFIL CITRATE 100 MG PO TABS
100.0000 mg | ORAL_TABLET | ORAL | 0 refills | Status: AC
  Filled 2024-05-11: qty 10, 10d supply, fill #0

## 2024-07-05 ENCOUNTER — Encounter (INDEPENDENT_AMBULATORY_CARE_PROVIDER_SITE_OTHER): Admitting: Ophthalmology
